# Patient Record
Sex: Female | Born: 1995 | Race: Black or African American | Hispanic: No | Marital: Single | Smoking: Never smoker
Health system: Southern US, Community
[De-identification: ages and names within clinical notes are randomized; demographics above are authoritative.]

## PROBLEM LIST (undated history)

## (undated) DIAGNOSIS — J45909 Unspecified asthma, uncomplicated: Secondary | ICD-10-CM

## (undated) HISTORY — DX: Unspecified asthma, uncomplicated: J45.909

---

## 2015-12-16 ENCOUNTER — Ambulatory Visit: Payer: Self-pay | Admitting: Internal Medicine

## 2015-12-25 ENCOUNTER — Encounter: Payer: Self-pay | Admitting: Internal Medicine

## 2015-12-25 ENCOUNTER — Ambulatory Visit (INDEPENDENT_AMBULATORY_CARE_PROVIDER_SITE_OTHER)
Admission: RE | Admit: 2015-12-25 | Discharge: 2015-12-25 | Disposition: A | Discharge status: Home / Self Care | Payer: BC Managed Care – PPO | Source: Ambulatory Visit | Attending: Internal Medicine | Admitting: Internal Medicine

## 2015-12-25 ENCOUNTER — Ambulatory Visit (INDEPENDENT_AMBULATORY_CARE_PROVIDER_SITE_OTHER): Payer: BC Managed Care – PPO | Admitting: Internal Medicine

## 2015-12-25 VITALS — BP 124/74 | HR 55 | Temp 97.9°F | Ht 69.0 in | Wt 149.0 lb

## 2015-12-25 DIAGNOSIS — R05 Cough: Secondary | ICD-10-CM

## 2015-12-25 DIAGNOSIS — R059 Cough, unspecified: Secondary | ICD-10-CM

## 2015-12-25 MED ORDER — ALBUTEROL SULFATE HFA 108 (90 BASE) MCG/ACT IN AERS: 2.0000 | INHALATION_SPRAY | Freq: Four times a day (QID) | RESPIRATORY_TRACT | Status: DC | PRN

## 2015-12-25 MED ORDER — PREDNISONE 10 MG PO TABS: ORAL_TABLET | ORAL | Status: DC

## 2015-12-25 NOTE — Assessment & Plan Note (Signed)
Etiology unclear, has mild right sided abnormal BS - ? Leading to cough variant asthma; for albut MDI and prednisone trial; consider more long term steroid inhaler if helps, for cxr today, consider pulm consult if not improved

## 2015-12-25 NOTE — Progress Notes (Signed)
   Subjective:    Patient ID: Grace Munoz, female    DOB: 04-25-1996, 20 y.o.   MRN: [id number]  HPI  Here as new pt to establish, has hx of childhood asthma resolved per mother, now with 5 mo persistent nonprod cough; did seem to have a viral illness with mild prod yellowish and fever to start, but the cough just has never then completely resolved.  Course complicated by an episode of reported otitis media last month, but denies overt nasal allergy symptoms or wheezing. Pt denies chest pain, increased sob or doe, wheezing, orthopnea, PND, increased LE swelling, palpitations, dizziness or syncope.  Pt denies new neurological symptoms such as new headache, or facial or extremity weakness or numbness   Pt denies polydipsia, polyuria, No recent travel or unusual pets at home  Denies worsening reflux, abd pain, dysphagia, n/v, bowel change or blood.   Not pregnant/ LMP last wk  Leaving on the train to [place] tonight for back to school. Past Medical History  Diagnosis Date  . Asthma     Childhood   History reviewed. No pertinent past surgical history.  reports that she has never smoked. She does not have any smokeless tobacco history on file. She reports that she does not drink alcohol or use illicit drugs. family history includes Hypertension in her mother. Allergies  Allergen Reactions  . Peanut-Containing Drug Products Anaphylaxis   No current outpatient prescriptions on file prior to visit.   No current facility-administered medications on file prior to visit.   Review of Systems .All otherwise neg per pt    Objective:   Physical Exam BP 124/74 mmHg  Pulse 55  Temp(Src) 97.9 F (36.6 C) (Oral)  Ht 5\' 9"  (1.753 m)  Wt 149 lb (67.586 kg)  BMI 21.99 kg/m2  SpO2 99%  LMP 12/17/2015 VS noted,  Constitutional: Pt appears in no significant distress HENT: Head: NCAT.  Right Ear: External ear normal.  Left Ear: External ear normal.  Eyes: . Pupils are equal, round, and reactive to  light. Conjunctivae and EOM are normal Neck: Normal range of motion. Neck supple.  Cardiovascular: Normal rate and regular rhythm.   Pulmonary/Chest: Effort normal and breath sounds without rales and right lung only rhonchi/wheeze noted.  Abd:  Soft, NT, ND, + BS Neurological: Pt is alert. Not confused , motor grossly intact Skin: Skin is warm. No rash, no LE edema Psychiatric: Pt behavior is normal. No agitation.      Assessment & Plan:

## 2015-12-25 NOTE — Patient Instructions (Signed)
Please take all new medication as prescribed - the inhaler and the prednisone  Please continue all other medications as before, and refills have been done if requested.  Please have the pharmacy call with any other refills you may need.  Please keep your appointments with your specialists as you may have planned  Please go to the XRAY Department in the Basement (go straight as you get off the elevator) for the x-ray testing  You will be contacted by phone if any changes need to be made immediately.  Otherwise, you will receive a letter about your results with an explanation, but please check with MyChart first.  Please remember to sign up for MyChart if you have not done so, as this will be important to you in the future with finding out test results, communicating by private email, and scheduling acute appointments online when needed.

## 2016-06-13 IMAGING — DX DG CHEST 2V
2 series · 2 of 2 positions shown · non-contrast
Comparison: None in PACs

CLINICAL DATA: Cough for the past 5 months; history of childhood
asthma, nonsmoker.

EXAM:
CHEST  2 VIEW

[chest pa]
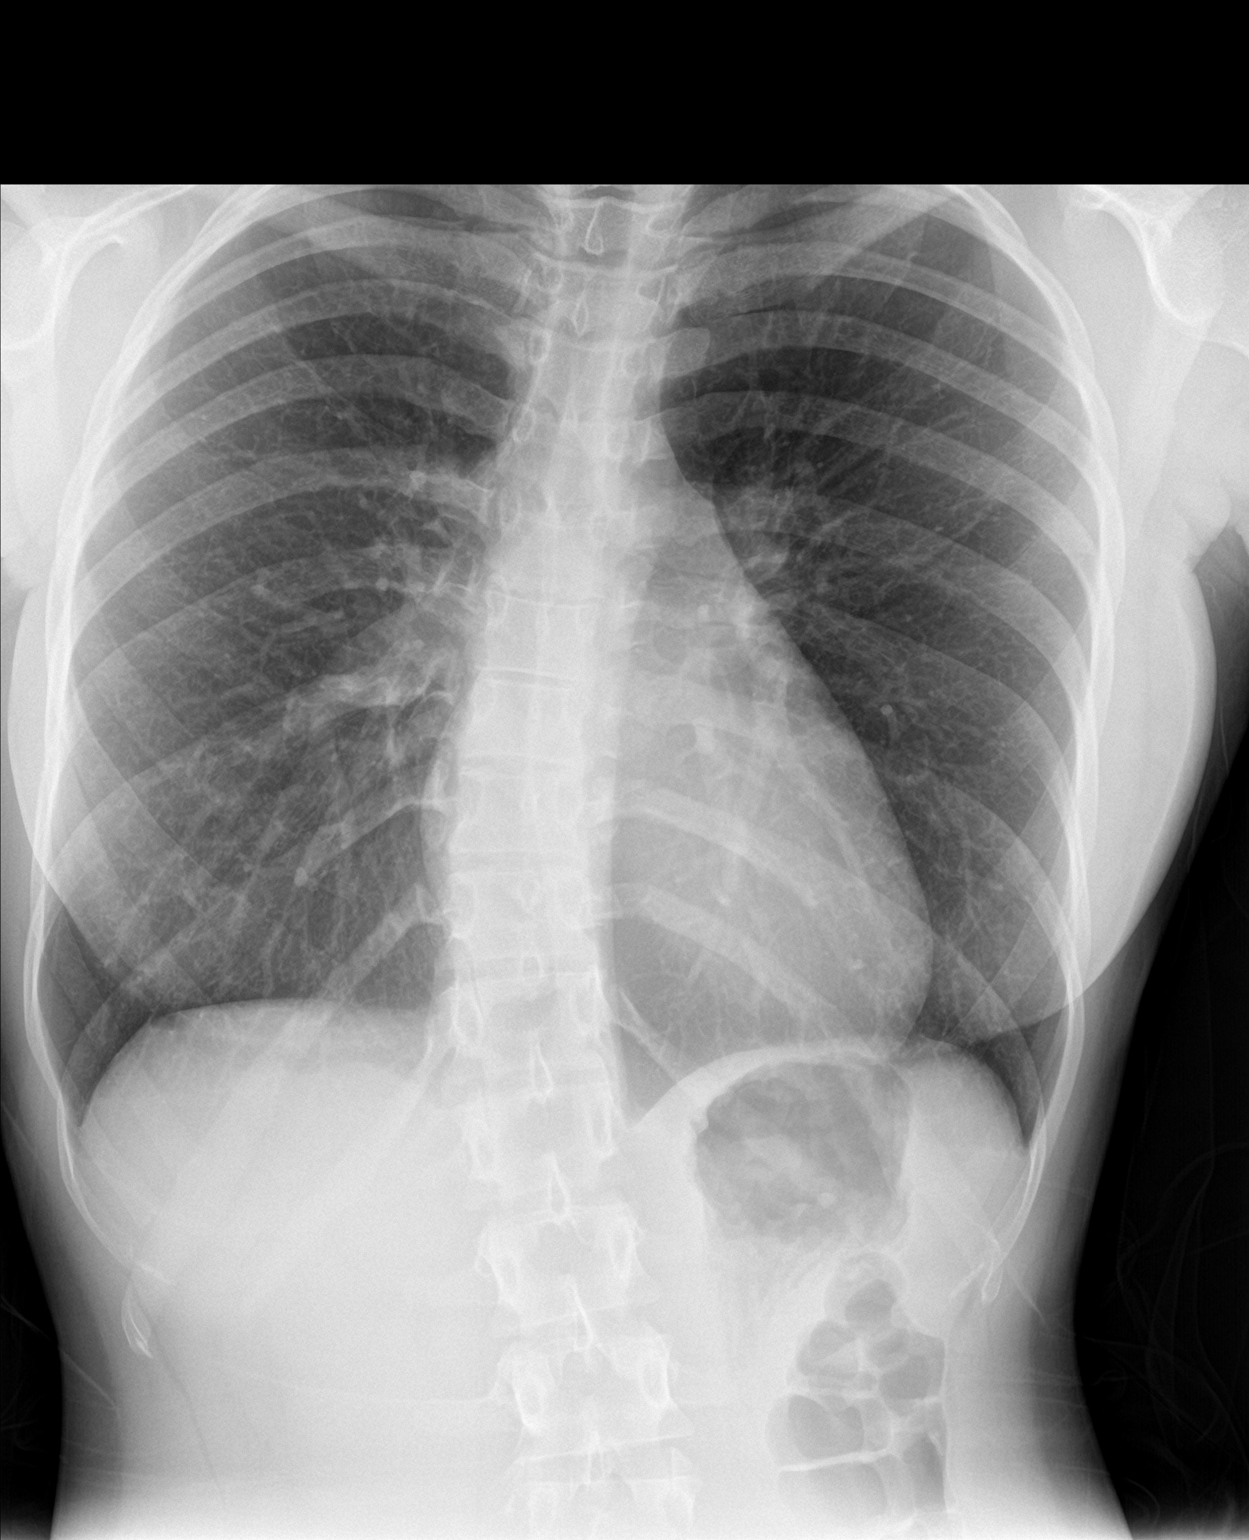

[chest lat]
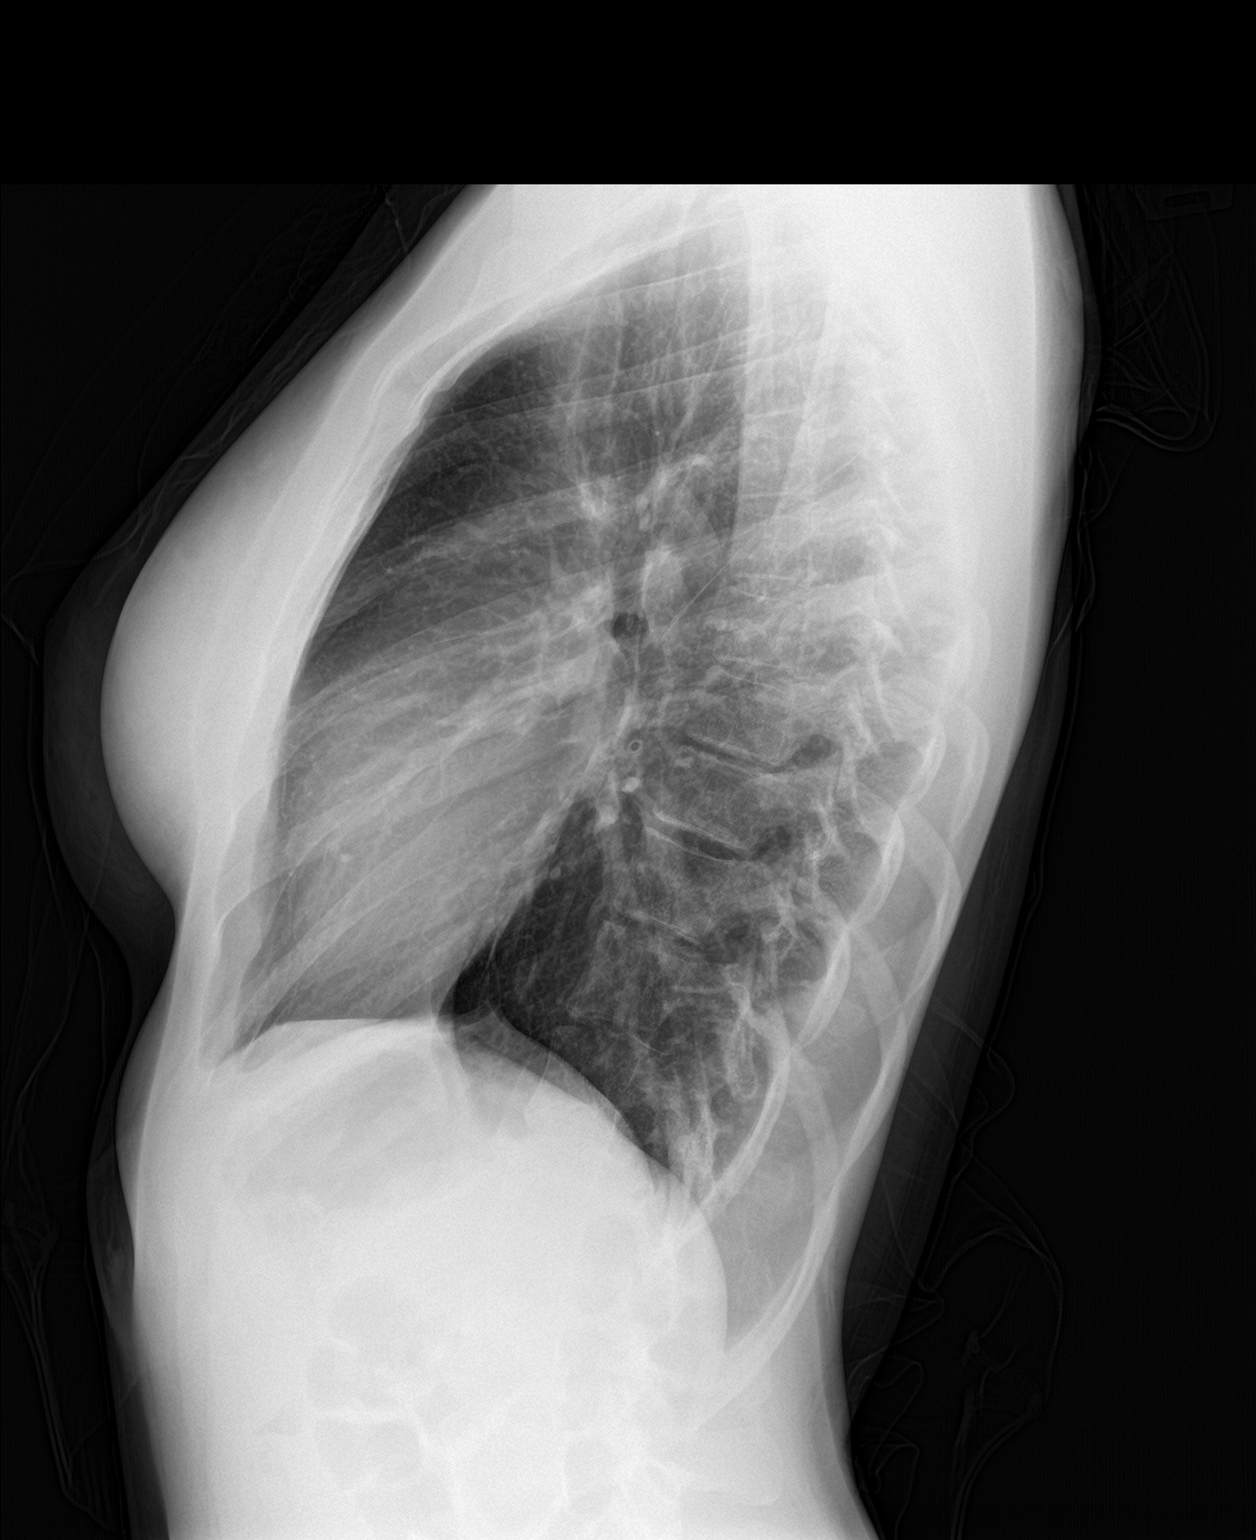

[2 of 2 positions shown; findings below may reference images not displayed]

FINDINGS: The lungs are well-expanded. There is no focal infiltrate. There is
no pleural effusion. The heart and pulmonary vascularity are normal.
The mediastinum is normal in width. There is moderate
dextrocurvature centered at approximately T10.
IMPRESSION: There is no active cardiopulmonary disease.

## 2016-08-04 ENCOUNTER — Ambulatory Visit: Payer: BC Managed Care – PPO | Admitting: Internal Medicine

## 2016-09-26 ENCOUNTER — Ambulatory Visit (INDEPENDENT_AMBULATORY_CARE_PROVIDER_SITE_OTHER): Payer: BC Managed Care – PPO | Admitting: Family Medicine

## 2016-09-26 ENCOUNTER — Ambulatory Visit: Payer: BC Managed Care – PPO | Admitting: Nurse Practitioner

## 2016-09-26 ENCOUNTER — Other Ambulatory Visit: Payer: Self-pay

## 2016-09-26 ENCOUNTER — Encounter: Payer: Self-pay | Admitting: Family Medicine

## 2016-09-26 VITALS — BP 118/70 | HR 60 | Temp 98.6°F | Ht 69.0 in | Wt 146.2 lb

## 2016-09-26 DIAGNOSIS — L74510 Primary focal hyperhidrosis, axilla: Secondary | ICD-10-CM | POA: Diagnosis not present

## 2016-09-26 MED ORDER — ALUMINUM CHLORIDE 20 % EX SOLN: Freq: Every day | CUTANEOUS | 0 refills | Status: DC

## 2016-09-26 NOTE — Progress Notes (Signed)
Subjective:    Patient ID: Grace Munoz, female    DOB: [birth year], 20 y.o.   MRN: [id number]  HPI  Grace Munoz is a 20 year old female who presents today with increased perspiration in the axillae. This has been noted for 2 years however she is concerned as this is resulting in obvious signs when she is wearing her clothing. Perspiration is noted bilaterally and she also notes this mainly in the axillae however palms and soles of feet can sweat at times also. She is seeking care as this is concerning for her when wearing clothing as she noticed sweating on her shirt in the axillae after she attended a social function at school.  Treatment at home includes switching deodorants that provides limited benefit. This works for a period of time however the symptoms of sweating will resume. Symptom improves during sleep. No association with an aggravating factor of stress is present. ROS is negative with the exception of sweating in axillae as noted above.  Review of Systems  Constitutional: Negative for chills, fatigue and fever.  Respiratory: Negative for cough, shortness of breath and wheezing.   Cardiovascular: Negative for chest pain and palpitations.  Gastrointestinal: Negative for abdominal pain, diarrhea, nausea and vomiting.  Endocrine: Negative for cold intolerance and heat intolerance.  Genitourinary: Negative for dysuria, frequency and hematuria.  Musculoskeletal: Negative for arthralgias and myalgias.  Neurological: Negative for dizziness, syncope, weakness, light-headedness, numbness and headaches.  Hematological: Does not bruise/bleed easily.  Psychiatric/Behavioral:       Denies depressed or anxious mood.   Past Medical History:  Diagnosis Date  . Asthma    Childhood     Social History   Social History  . Marital status: Single    Spouse name: N/A  . Number of children: N/A  . Years of education: N/A   Occupational History  . Not on file.   Social History Main Topics  .  Smoking status: Never Smoker  . Smokeless tobacco: Not on file  . Alcohol use No  . Drug use: No  . Sexual activity: Not on file   Other Topics Concern  . Not on file   Social History Narrative  . No narrative on file    No past surgical history on file.  Family History  Problem Relation Age of Onset  . Hypertension Mother     Allergies  Allergen Reactions  . Peanut-Containing Drug Products Anaphylaxis    Current Outpatient Prescriptions on File Prior to Visit  Medication Sig Dispense Refill  . albuterol (PROVENTIL HFA;VENTOLIN HFA) 108 (90 Base) MCG/ACT inhaler Inhale 2 puffs into the lungs every 6 (six) hours as needed for wheezing or shortness of breath. 1 Inhaler 5   No current facility-administered medications on file prior to visit.     BP 118/70 (BP Location: Right Arm, Patient Position: Sitting, Cuff Size: Normal)   Pulse 60   Temp 98.6 F (37 C) (Oral)   Ht 5\' 9"  (1.753 m)   Wt 146 lb 3.2 oz (66.3 kg)   LMP 09/19/2016   BMI 21.59 kg/m       Objective:   Physical Exam  Constitutional: She is oriented to person, place, and time. She appears well-developed and well-nourished.  Eyes: Pupils are equal, round, and reactive to light. No scleral icterus.  Neck: Neck supple.  Cardiovascular: Normal rate and regular rhythm.   Pulmonary/Chest: Effort normal and breath sounds normal. She has no wheezes. She has no rales.  Abdominal: Soft.  Bowel sounds are normal. She exhibits no distension. There is no tenderness. There is no rebound.  Musculoskeletal: She exhibits no edema.  Lymphadenopathy:    She has no cervical adenopathy.    She has no axillary adenopathy.  Neurological: She is alert and oriented to person, place, and time. Coordination normal.  Skin: Skin is warm, dry and intact. No rash noted.  Mild perspiration noted in axillae bilaterally. Minimal perspiration noted on palms bilaterally  Psychiatric: She has a normal mood and affect. Her behavior is  normal. Judgment and thought content normal.        Assessment & Plan:  1. 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 We discussed other options that can be considered after trial of drysol that will require follow up with her PCP and possible dermatology referral. Patient voiced understanding and agreed with plan. - aluminum chloride (DRYSOL) 20 % external solution; Apply topically at bedtime.  Dispense: 35 mL; Refill: 0  Advised patient to follow up with PCP as recommended. She voiced understanding and agreed with plan.  Roddie Mc, FNP-C

## 2016-09-26 NOTE — Progress Notes (Signed)
Pre visit review using our clinic review tool, if applicable. No additional management support is needed unless otherwise documented below in the visit note. 

## 2016-09-26 NOTE — Patient Instructions (Signed)
Please use antiperspirant at night after drying skin thoroughly. If you notice any irritation, you can also use hydrocortisone cream. Follow up with your provider if symptoms do not improve with this treatment and as recommended by him.  Hyperhidrosis It is normal to sweat when you are hot, being physically active, or feeling anxious. Sweating is a necessary function for your body. However, hyperhidrosis is when you sweat too much (excessively). Although hyperhidrosis is not dangerous, it can make you feel embarrassed.  There are two kinds of hyperhidrosis:  Primary hyperhidrosis. The sweating usually localizes in one part of your body, such as your underarms, or in a few areas, such as your feet, face, armpits, and hands. This is the more common kind of hyperhidrosis.  Secondary hyperhidrosis. This type more likely affects your entire body. CAUSES The cause of your hyperhidrosis depends on the kind you have.  Primary hyperhidrosis may be caused by having sweat glands that are more active than normal.  Secondary hyperhidrosis is caused by an underlying condition. Possible conditions include:  Diabetes.  Gout.  Certain medicines.  Anxiety.  Stroke.  Obesity.  Menopause.  Overactive thyroid (hyperthyroidism).  Tumors.  Frostbite.  Certain types of cancers.  Alcoholism.  Injury to your nervous system.  Stroke.  Parkinson disease. RISK FACTORS You may be at an increased risk for primary hyperhidrosis if you have a family history of it. SIGNS AND SYMPTOMS General symptoms of hyperhidrosis may include:  Feeling like you are sweating constantly, even while you are resting.  Having skin that peels or gets paler or softer in the areas where you sweat the most.  Being able to see sweat on your skin. Symptoms of primary hyperhidrosis may include:  Sweating in specific areas, such as your armpits, palms, feet, and face.  Sweating in the same location on both sides of  your body.  Sweating only during the day. Symptoms of secondary hyperhidrosis may include:  Sweating all over your body.  Sweating even while you sleep. DIAGNOSIS  Hyperhidrosis may be diagnosed by:  Medical history and physical exam.  Testing, such as:  Sweat test.  Paper test. TREATMENT Your treatment will depend on the kind of hyperhidrosis you have and the parts of your body that are affected. If your hyperhidrosis is caused by an underlying condition, your treatment will address the cause. Treatment may include:  Strong antiperspirants. Your health care provider may give you a prescription.  Medicines taken by mouth.  Medicines injected by your health care provider. These may include small amounts of botulinum toxin.  Iontophoresis. This is a procedure that temporarily turns off the sweat glands in your hands and feet.  Surgery to remove your sweat glands.  Sympathectomy. This is a procedure that cuts or destroys your nerves so that they do not send a signal to sweat. HOME CARE INSTRUCTIONS  Take medicines only as directed by your health care provider.  Use antiperspirants as directed by your health care provider.  Limit or avoid foods or beverages that seem to increase your chances of sweating, such as:  Spicy food.  Caffeine.  Alcohol.  Foods that contain MSG.  If your feet sweat:  Wear sandals, when possible.  Do not wear cotton socks. Wear socks that remove or wick moisture from your feet.  Wear leather shoes.  Avoid wearing the same pair of shoes two days in a row.  Consider joining a hyperhidrosis support group. SEEK MEDICAL CARE IF:   You have new symptoms.  Your symptoms get worse.   This information is not intended to replace advice given to you by your health care provider. Make sure you discuss any questions you have with your health care provider.   Document Released: 02/03/2006 Document Revised: 12/26/2014 Document Reviewed:  07/15/2014 Elsevier Interactive Patient Education [employer]2016 [employer]vier Inc.

## 2017-01-13 ENCOUNTER — Encounter: Payer: Self-pay | Admitting: Internal Medicine

## 2017-01-13 ENCOUNTER — Other Ambulatory Visit (INDEPENDENT_AMBULATORY_CARE_PROVIDER_SITE_OTHER): Payer: BC Managed Care – PPO

## 2017-01-13 ENCOUNTER — Other Ambulatory Visit: Payer: Self-pay | Admitting: Internal Medicine

## 2017-01-13 ENCOUNTER — Ambulatory Visit (INDEPENDENT_AMBULATORY_CARE_PROVIDER_SITE_OTHER): Payer: BC Managed Care – PPO | Admitting: Internal Medicine

## 2017-01-13 VITALS — BP 118/76 | HR 84 | Temp 98.2°F | Resp 20 | Wt 140.0 lb

## 2017-01-13 DIAGNOSIS — Z0001 Encounter for general adult medical examination with abnormal findings: Secondary | ICD-10-CM

## 2017-01-13 DIAGNOSIS — Z0000 Encounter for general adult medical examination without abnormal findings: Secondary | ICD-10-CM | POA: Insufficient documentation

## 2017-01-13 DIAGNOSIS — D649 Anemia, unspecified: Secondary | ICD-10-CM

## 2017-01-13 DIAGNOSIS — F411 Generalized anxiety disorder: Secondary | ICD-10-CM | POA: Insufficient documentation

## 2017-01-13 DIAGNOSIS — Z91010 Allergy to peanuts: Secondary | ICD-10-CM

## 2017-01-13 LAB — CBC WITH DIFFERENTIAL/PLATELET
Basophils Absolute: 0 10*3/uL (ref 0.0–0.1)
Basophils Relative: 0.3 % (ref 0.0–3.0)
EOS PCT: 5.6 % — AB (ref 0.0–5.0)
Eosinophils Absolute: 0.3 10*3/uL (ref 0.0–0.7)
HCT: 36.7 % (ref 36.0–46.0)
HEMOGLOBIN: 12 g/dL (ref 12.0–15.0)
Lymphocytes Relative: 36.9 % (ref 12.0–46.0)
Lymphs Abs: 1.8 10*3/uL (ref 0.7–4.0)
MCHC: 32.6 g/dL (ref 30.0–36.0)
MCV: 80.8 fl (ref 78.0–100.0)
MONOS PCT: 5.8 % (ref 3.0–12.0)
Monocytes Absolute: 0.3 10*3/uL (ref 0.1–1.0)
Neutro Abs: 2.5 10*3/uL (ref 1.4–7.7)
Neutrophils Relative %: 51.4 % (ref 43.0–77.0)
Platelets: 219 10*3/uL (ref 150.0–400.0)
RBC: 4.54 Mil/uL (ref 3.87–5.11)
RDW: 16.2 % — ABNORMAL HIGH (ref 11.5–14.6)
WBC: 4.9 10*3/uL (ref 4.5–10.5)

## 2017-01-13 LAB — HEPATIC FUNCTION PANEL
ALBUMIN: 4.3 g/dL (ref 3.5–5.2)
ALT: 9 U/L (ref 0–35)
AST: 14 U/L (ref 0–37)
Alkaline Phosphatase: 50 U/L (ref 39–117)
Bilirubin, Direct: 0.1 mg/dL (ref 0.0–0.3)
Total Bilirubin: 0.3 mg/dL (ref 0.2–1.2)
Total Protein: 7.6 g/dL (ref 6.0–8.3)

## 2017-01-13 LAB — IBC PANEL
Iron: 70 ug/dL (ref 42–145)
Saturation Ratios: 14.7 % — ABNORMAL LOW (ref 20.0–50.0)
TRANSFERRIN: 340 mg/dL (ref 212.0–360.0)

## 2017-01-13 LAB — URINALYSIS, ROUTINE W REFLEX MICROSCOPIC
Bilirubin Urine: NEGATIVE
KETONES UR: NEGATIVE
NITRITE: NEGATIVE
PH: 6 (ref 5.0–8.0)
Specific Gravity, Urine: >=1.03 — AB (ref 1.000–1.030)
URINE GLUCOSE: NEGATIVE
Urobilinogen, UA: 0.2 (ref 0.0–1.0)

## 2017-01-13 LAB — BASIC METABOLIC PANEL
BUN: 8 mg/dL (ref 6–23)
CALCIUM: 9.5 mg/dL (ref 8.4–10.5)
CO2: 30 mEq/L (ref 19–32)
CREATININE: 0.93 mg/dL (ref 0.40–1.20)
Chloride: 104 mEq/L (ref 96–112)
GFR: 98.02 mL/min (ref >60.00)
GLUCOSE: 85 mg/dL (ref 70–99)
Potassium: 4 mEq/L (ref 3.5–5.1)
Sodium: 140 mEq/L (ref 135–145)

## 2017-01-13 LAB — LIPID PANEL
CHOLESTEROL: 134 mg/dL (ref 0–200)
HDL: 67.8 mg/dL (ref >39.00)
LDL CALC: 58 mg/dL (ref 0–99)
NonHDL: 66.22
TRIGLYCERIDES: 42 mg/dL (ref 0.0–149.0)
Total CHOL/HDL Ratio: 2
VLDL: 8.4 mg/dL (ref 0.0–40.0)

## 2017-01-13 LAB — TSH: TSH: 1.78 u[IU]/mL (ref 0.35–5.50)

## 2017-01-13 MED ORDER — CEPHALEXIN 500 MG PO CAPS: 500.0000 mg | ORAL_CAPSULE | Freq: Three times a day (TID) | ORAL | 0 refills | Status: AC

## 2017-01-13 MED ORDER — EPINEPHRINE 0.3 MG/0.3ML IJ SOAJ: 0.3000 mg | Freq: Once | INTRAMUSCULAR | 2 refills | Status: AC

## 2017-01-13 NOTE — Patient Instructions (Addendum)
Please take all new medication as prescribed  - the epipen for severe reactions  Please also have some OTC Bendaryl 50 mg available to use for lesser reactions  Please continue all other medications as before, and refills have been done if requested.  Please have the pharmacy call with any other refills you may need.  Please continue your efforts at being more active, low cholesterol diet, and weight control.  You are otherwise up to date with prevention measures today.  Please keep your appointments with your specialists as you may have planned  Please go to the LAB in the Basement (turn left off the elevator) for the tests to be done today  You will be contacted by phone if any changes need to be made immediately.  Otherwise, you will receive a letter about your results with an explanation, but please check with MyChart first.  Please remember to sign up for MyChart if you have not done so, as this will be important to you in the future with finding out test results, communicating by private email, and scheduling acute appointments online when needed.

## 2017-01-13 NOTE — Progress Notes (Signed)
Pre visit review using our clinic review tool, if applicable. No additional management support is needed unless otherwise documented below in the visit note. 

## 2017-01-13 NOTE — Telephone Encounter (Signed)
Done hardcopy to Corinne as there is no local pharmacy of record

## 2017-01-13 NOTE — Telephone Encounter (Signed)
Called patient waiting on response

## 2017-01-13 NOTE — Progress Notes (Signed)
Subjective:    Patient ID: Grace Munoz, female    DOB: 1996/02/29, 21 y.o.   MRN: [id number]  HPI  Here for wellness and f/u;  Overall doing ok;  Pt denies Chest pain, worsening SOB, DOE, wheezing, orthopnea, PND, worsening LE edema, palpitations, dizziness or syncope.  Pt denies neurological change such as new headache, facial or extremity weakness.  Pt denies polydipsia, polyuria, or low sugar symptoms. Pt states overall good compliance with treatment and medications, good tolerability, and has been trying to follow appropriate diet.  Pt denies worsening depressive symptoms, suicidal ideation or panic. No fever, night sweats, wt loss, loss of appetite, or other constitutional symptoms.  Pt states good ability with ADL's, has low fall risk, home safety reviewed and adequate, no other significant changes in hearing or vision, and usually active with exercise.  Declines immunizations  Did have episode recenlty with tongue swelling after eating peanut. Past Medical History:  Diagnosis Date  . Asthma    Childhood   No past surgical history on file.  reports that she has never smoked. She does not have any smokeless tobacco history on file. She reports that she does not drink alcohol or use drugs. family history includes Hypertension in her mother. Allergies  Allergen Reactions  . Peanut-Containing Drug Products Anaphylaxis   Current Outpatient Prescriptions on File Prior to Visit  Medication Sig Dispense Refill  . albuterol (PROVENTIL HFA;VENTOLIN HFA) 108 (90 Base) MCG/ACT inhaler Inhale 2 puffs into the lungs every 6 (six) hours as needed for wheezing or shortness of breath. (Patient not taking: Reported on 01/13/2017) 1 Inhaler 5  . aluminum chloride (DRYSOL) 20 % external solution Apply topically at bedtime. (Patient not taking: Reported on 01/13/2017) 35 mL 0   No current facility-administered medications on file prior to visit.     Review of Systems Constitutional: Negative for increased  diaphoresis, or other activity, appetite or siginficant weight change other than noted HENT: Negative for worsening hearing loss, ear pain, facial swelling, mouth sores and neck stiffness.   Eyes: Negative for other worsening pain, redness or visual disturbance.  Respiratory: Negative for choking or stridor Cardiovascular: Negative for other chest pain and palpitations.  Gastrointestinal: Negative for worsening diarrhea, blood in stool, or abdominal distention Genitourinary: Negative for hematuria, flank pain or change in urine volume.  Musculoskeletal: Negative for myalgias or other joint complaints.  Skin: Negative for other color change and wound or drainage.  Neurological: Negative for syncope and numbness. other than noted Hematological: Negative for adenopathy. or other swelling Psychiatric/Behavioral: Negative for hallucinations, SI, self-injury, decreased concentration or other worsening agitation.  All other system neg per pt    Objective:   Physical Exam BP 118/76   Pulse 84   Temp 98.2 F (36.8 C) (Oral)   Resp 20   Wt 140 lb (63.5 kg)   SpO2 97%   BMI 20.67 kg/m  VS noted,  Constitutional: Pt is oriented to person, place, and time. Appears well-developed and well-nourished, in no significant distress Head: Normocephalic and atraumatic  Eyes: Conjunctivae and EOM are normal. Pupils are equal, round, and reactive to light Right Ear: External ear normal.  Left Ear: External ear normal Nose: Nose normal.  Mouth/Throat: Oropharynx is clear and moist  Neck: Normal range of motion. Neck supple. No JVD present. No tracheal deviation present or significant neck LA or mass Cardiovascular: Normal rate, regular rhythm, normal heart sounds and intact distal pulses.   Pulmonary/Chest: Effort normal and breath sounds without  rales or wheezing  Abdominal: Soft. Bowel sounds are normal. NT. No HSM  Musculoskeletal: Normal range of motion. Exhibits no edema Lymphadenopathy: Has no  cervical adenopathy.  Neurological: Pt is alert and oriented to person, place, and time. Pt has normal reflexes. No cranial nerve deficit. Motor grossly intact Skin: Skin is warm and dry. No rash noted or new ulcers Psychiatric:  Has normal mood and affect. Behavior is normal.  No other new exam findings       Assessment & Plan:

## 2017-01-14 NOTE — Assessment & Plan Note (Signed)
For epipen, and benadryl 50 q 6 prn, avoid peanut, declines allergy referral

## 2017-01-14 NOTE — Assessment & Plan Note (Signed)

## 2017-01-14 NOTE — Assessment & Plan Note (Signed)
Also for iron panel with labs,  to f/u any worsening symptoms or concerns  

## 2017-01-23 ENCOUNTER — Telehealth: Payer: Self-pay | Admitting: Internal Medicine

## 2017-01-23 NOTE — Telephone Encounter (Signed)
Please return phone call ° °

## 2017-01-23 NOTE — Telephone Encounter (Signed)
States patient never got keflex.  Is requesting to get in touch with [employer].  Please follow up in regard.

## 2017-01-23 NOTE — Telephone Encounter (Signed)
Patients mother called back. The pharmacy [facility] [place]. Phone number is [phone]. Thanks.

## 2017-01-23 NOTE — Telephone Encounter (Signed)
Called patients mother, waiting for a return phone call back.

## 2017-01-23 NOTE — Telephone Encounter (Signed)
Spoke to patient mom they are getting the information for the pharmacy at school so we can send the prescription there

## 2017-01-24 ENCOUNTER — Telehealth: Payer: Self-pay

## 2017-01-24 MED ORDER — CEPHALEXIN 500 MG PO CAPS: 500.0000 mg | ORAL_CAPSULE | Freq: Three times a day (TID) | ORAL | 0 refills | Status: DC

## 2017-01-24 NOTE — Telephone Encounter (Signed)
Medication was sent to pharmacy.

## 2017-01-24 NOTE — Telephone Encounter (Signed)
Medication sent to pharmacy  

## 2017-02-20 NOTE — Progress Notes (Signed)
[name] [address] [place], [place] [address] Phone: [phone] Subjective:    I'm seeing this patient by the request  of:  Oliver Barre, MD   CC: Right swollen ankle  [id number]  Grace Munoz is a 21 y.o. female coming in with complaint of right ankle swelling, after running 5k a month ago.  Still was able to workout. Patient states unfortunate she has noticed that it seems to be larger on the outside of her ankle. Patient states that it is not extremely painful. Patient states though that that is always there. Patient did play volleyball highly competitively for quite some time and did have multiple ankle injuries. States that this wound felt a little different. Patient states that she is almost pain free at this point but is concerned about increasing activity. No numbness, no weakness.      Past Medical History:  Diagnosis Date  . Asthma    Childhood   No past surgical history on file. Social History   Social History  . Marital status: Single    Spouse name: N/A  . Number of children: N/A  . Years of education: N/A   Social History Main Topics  . Smoking status: Never Smoker  . Smokeless tobacco: Not on file  . Alcohol use No  . Drug use: No  . Sexual activity: Not on file   Other Topics Concern  . Not on file   Social History Narrative  . No narrative on file   Allergies  Allergen Reactions  . Peanut-Containing Drug Products Anaphylaxis   Family History  Problem Relation Age of Onset  . Hypertension Mother     Past medical history, social, surgical and family history all reviewed in electronic medical record.  No pertanent information unless stated regarding to the chief complaint.   Review of Systems:Review of systems updated and as accurate as of 02/21/17  No headache, visual changes, nausea, vomiting, diarrhea, constipation, dizziness, abdominal pain, skin rash, fevers, chills, night sweats, weight loss, swollen  lymph nodes, body aches, joint swelling, muscle aches, chest pain, shortness of breath, mood changes.   Objective  Blood pressure 112/82, pulse 62, height 5\' 9"  (1.753 m), weight 143 lb (64.9 kg). Systems examined below as of 02/21/17   General: No apparent distress alert and oriented x3 mood and affect normal, dressed appropriately.  HEENT: Pupils equal, extraocular movements intact  Respiratory: Patient's speak in full sentences and does not appear short of breath  Cardiovascular: No lower extremity edema, non tender, no erythema  Skin: Warm dry intact with no signs of infection or rash on extremities or on axial skeleton.  Abdomen: Soft nontender  Neuro: Cranial nerves II through XII are intact, neurovascularly intact in all extremities with 2+ DTRs and 2+ pulses.  Lymph: No lymphadenopathy of posterior or anterior cervical chain or axillae bilaterally.  Gait normal with good balance and coordination.  MSK:  Non tender with full range of motion and good stability and symmetric strength and tone of shoulders, elbows, wrist, hip, knees bilaterally.  Foot exam shows pes planus bilaterally. There'll foot Ankle: Right Mild soft tissue swelling over the lateral malleolus area. Range of motion is full in all directions. Strength is 5/5 in all directions. Stable lateral and medial ligaments; squeeze test and kleiger test unremarkable; Talar dome nontender; No pain at base of 5th MT; No tenderness over cuboid; No tenderness over N spot or navicular prominence No tenderness on posterior aspects of lateral  and medial malleolus No sign of peroneal tendon subluxations or tenderness to palpation Negative tarsal tunnel tinel's Able to walk 4 steps.  MSK US performed of: Right ankle This study was ordered, performed, and interpreted by Terrilee FilesZach Smith D.O.  Foot/Ankle:   All structures visualized.   Talar dome unremarkable  Ankle mortise without effusion. Patient does have some hypertrophy of the  peroneus longus tendon as well as what seems to be an overlying lipoma. No increasing Doppler flow. Underlying lateral malleolus does having increasing Doppler flow. Mild hypoechoic over the bone itself.  IMPRESSION: Lateral malleolus stress fracture with overlying lipoma.  Procedure note 97110; 15 minutes spent for Therapeutic exercises as stated in above notes.  This included exercises focusing on stretching, strengthening, with significant focus on eccentric aspects.  Ankle strengthening that included:  Basic range of motion exercises to allow proper full motion at ankle Stretching of the lower leg and hamstrings  Theraband exercises for the lower leg - inversion, eversion, dorsiflexion and plantarflexion each to be completed with a theraband Balance exercises to increase proprioception Weight bearing exercises to increase strength and balance  Proper technique shown and discussed handout in great detail with ATC.  All questions were discussed and answered.     Impression and Recommendations:     This case required medical decision making of moderate complexity.      Note: This dictation was prepared with Dragon dictation along with smaller phrase technology. Any transcriptional errors that result from this process are unintentional.

## 2017-02-21 ENCOUNTER — Ambulatory Visit: Payer: Self-pay

## 2017-02-21 ENCOUNTER — Ambulatory Visit (INDEPENDENT_AMBULATORY_CARE_PROVIDER_SITE_OTHER): Payer: BC Managed Care – PPO | Admitting: Family Medicine

## 2017-02-21 VITALS — BP 112/82 | HR 62 | Ht 69.0 in | Wt 143.0 lb

## 2017-02-21 DIAGNOSIS — M84369A Stress fracture, unspecified tibia and fibula, initial encounter for fracture: Secondary | ICD-10-CM | POA: Insufficient documentation

## 2017-02-21 DIAGNOSIS — M25571 Pain in right ankle and joints of right foot: Secondary | ICD-10-CM | POA: Diagnosis not present

## 2017-02-21 MED ORDER — VITAMIN D (ERGOCALCIFEROL) 1.25 MG (50000 UNIT) PO CAPS: 50000.0000 [IU] | ORAL_CAPSULE | ORAL | 0 refills | Status: DC

## 2017-02-21 NOTE — Assessment & Plan Note (Signed)
I believe the patient's pain was likely secondary to a stress reaction. Put on once weekly vitamin D that it think will be beneficial. We discussed with patient that icing regimen. Home exercises given today. We discussed objective is to do a which ones to potentially avoid. I do not feel that further workup is necessary at this time but if and there is any increasing in enlargement we will get x-rays. Likelihood of any type of bony abnormality other than a stress reaction is very rare at this time. Patient try these changes and come back and see me again in 4 weeks.

## 2017-02-21 NOTE — Patient Instructions (Addendum)
Good to see you.  Ice 20 minutes 2 times daily. Usually after activity and before bed. Exercises 3 times a week.  pennsaid pinkie amount topically 2 times daily as needed.  Once weekly vitamin D for 12 weeks to help healing.  Good shoes with rigid bottom.  Dierdre HarnessKeen, Dansko, Merrell or New balance greater then 700, I know you are not going to do this.  I want you to see me again in 6 weeks to make sure you are doing well Have a fun and safe spring break!

## 2017-03-31 ENCOUNTER — Ambulatory Visit (INDEPENDENT_AMBULATORY_CARE_PROVIDER_SITE_OTHER): Payer: BC Managed Care – PPO | Admitting: Family Medicine

## 2017-03-31 ENCOUNTER — Ambulatory Visit: Payer: Self-pay

## 2017-03-31 ENCOUNTER — Encounter: Payer: Self-pay | Admitting: Family Medicine

## 2017-03-31 VITALS — BP 116/80 | HR 53 | Resp 16 | Wt 140.5 lb

## 2017-03-31 DIAGNOSIS — M25571 Pain in right ankle and joints of right foot: Secondary | ICD-10-CM

## 2017-03-31 DIAGNOSIS — M84369D Stress fracture, unspecified tibia and fibula, subsequent encounter for fracture with routine healing: Secondary | ICD-10-CM | POA: Diagnosis not present

## 2017-03-31 NOTE — Assessment & Plan Note (Signed)
No longer having any pain at this time. Discussed with patient at great length. Patient will increase activity as tolerated. Worsening pain come back for further evaluation otherwise follow-up as needed.

## 2017-03-31 NOTE — Progress Notes (Signed)
  [name] [address] [place], [place] [address] Phone: [phone] Subjective:    I'm seeing this patient by the request  of:  Oliver Barre, MD   CC: Right swollen ankle f/u   [id number]  Grace Munoz is a 21 y.o. female coming in with complaint of right ankle swelling, Patient was found to have what appeared to be a stress reaction of the ankle. Patient has been doing conservative therapy as well as vitamin D. Patient has done significantly improved at this time. Patient states that she is not having any pain at this time.      Past Medical History:  Diagnosis Date  . Asthma    Childhood   History reviewed. No pertinent surgical history. Social History   Social History  . Marital status: Single    Spouse name: N/A  . Number of children: N/A  . Years of education: N/A   Social History Main Topics  . Smoking status: Never Smoker  . Smokeless tobacco: Never Used  . Alcohol use No  . Drug use: No  . Sexual activity: Not Asked   Other Topics Concern  . None   Social History Narrative  . None   Allergies  Allergen Reactions  . Peanut-Containing Drug Products Anaphylaxis   Family History  Problem Relation Age of Onset  . Hypertension Mother     Past medical history, social, surgical and family history all reviewed in electronic medical record.  No pertanent information unless stated regarding to the chief complaint.   Review of Systems: No headache, visual changes, nausea, vomiting, diarrhea, constipation, dizziness, abdominal pain, skin rash, fevers, chills, night sweats, weight loss, swollen lymph nodes, body aches, joint swelling, muscle aches, chest pain, shortness of breath, mood changes.    Objective  Blood pressure 116/80, pulse (!) 53, resp. rate 16, weight 140 lb 8 oz (63.7 kg), SpO2 93 %.  Systems examined below as of 03/31/17 General: NAD A&O x3 mood, affect normal  HEENT: Pupils equal, extraocular movements intact  no nystagmus Respiratory: not short of breath at rest or with speaking Cardiovascular: No lower extremity edema, non tender Skin: Warm dry intact with no signs of infection or rash on extremities or on axial skeleton. Abdomen: Soft nontender, no masses Neuro: Cranial nerves  intact, neurovascularly intact in all extremities with 2+ DTRs and 2+ pulses. Lymph: No lymphadenopathy appreciated today  Gait normal with good balance and coordination.  MSK: Non tender with full range of motion and good stability and symmetric strength and tone of shoulders, elbows, wrist,  knee hips and ankles bilaterally.   Foot exam shows pes planus bilaterally.  Ankle: Right No visible erythema or swelling. Range of motion is full in all directions. Strength is 5/5 in all directions. Stable lateral and medial ligaments; squeeze test and kleiger test unremarkable; Talar dome nontender; No pain at base of 5th MT; No tenderness over cuboid; No tenderness over N spot or navicular prominence No tenderness on posterior aspects of lateral and medial malleolus No sign of peroneal tendon subluxations or tenderness to palpation Negative tarsal tunnel tinel's Able to walk 4 steps. Contralateral ankle unremarkable     Impression and Recommendations:     This case required medical decision making of moderate complexity.      Note: This dictation was prepared with Dragon dictation along with smaller phrase technology. Any transcriptional errors that result from this process are unintentional.

## 2017-03-31 NOTE — Progress Notes (Signed)
Pre-visit discussion using our clinic review tool. No additional management support is needed unless otherwise documented below in the visit note.  

## 2018-05-02 ENCOUNTER — Ambulatory Visit: Payer: BC Managed Care – PPO | Admitting: Internal Medicine

## 2018-05-03 ENCOUNTER — Ambulatory Visit (INDEPENDENT_AMBULATORY_CARE_PROVIDER_SITE_OTHER): Payer: BC Managed Care – PPO | Admitting: Internal Medicine

## 2018-05-03 ENCOUNTER — Other Ambulatory Visit (INDEPENDENT_AMBULATORY_CARE_PROVIDER_SITE_OTHER): Payer: BC Managed Care – PPO

## 2018-05-03 ENCOUNTER — Encounter: Payer: Self-pay | Admitting: Internal Medicine

## 2018-05-03 VITALS — BP 114/76 | HR 68 | Temp 98.6°F | Ht 69.0 in | Wt 142.0 lb

## 2018-05-03 DIAGNOSIS — Z114 Encounter for screening for human immunodeficiency virus [HIV]: Secondary | ICD-10-CM | POA: Diagnosis not present

## 2018-05-03 DIAGNOSIS — F411 Generalized anxiety disorder: Secondary | ICD-10-CM | POA: Diagnosis not present

## 2018-05-03 DIAGNOSIS — Z0000 Encounter for general adult medical examination without abnormal findings: Secondary | ICD-10-CM

## 2018-05-03 DIAGNOSIS — F419 Anxiety disorder, unspecified: Secondary | ICD-10-CM

## 2018-05-03 LAB — BASIC METABOLIC PANEL
BUN: 12 mg/dL (ref 6–23)
CALCIUM: 9.6 mg/dL (ref 8.4–10.5)
CO2: 29 mEq/L (ref 19–32)
CREATININE: 0.87 mg/dL (ref 0.40–1.20)
Chloride: 102 mEq/L (ref 96–112)
GFR: 104.57 mL/min (ref >60.00)
Glucose, Bld: 72 mg/dL (ref 70–99)
Potassium: 4.1 mEq/L (ref 3.5–5.1)
SODIUM: 137 meq/L (ref 135–145)

## 2018-05-03 LAB — HEPATIC FUNCTION PANEL
ALBUMIN: 4.5 g/dL (ref 3.5–5.2)
ALT: 12 U/L (ref 0–35)
AST: 20 U/L (ref 0–37)
Alkaline Phosphatase: 57 U/L (ref 39–117)
Bilirubin, Direct: 0.1 mg/dL (ref 0.0–0.3)
TOTAL PROTEIN: 8 g/dL (ref 6.0–8.3)
Total Bilirubin: 0.3 mg/dL (ref 0.2–1.2)

## 2018-05-03 LAB — CBC WITH DIFFERENTIAL/PLATELET
BASOS ABS: 0 10*3/uL (ref 0.0–0.1)
BASOS PCT: 0.8 % (ref 0.0–3.0)
EOS ABS: 0.2 10*3/uL (ref 0.0–0.7)
Eosinophils Relative: 4.1 % (ref 0.0–5.0)
HCT: 38.7 % (ref 36.0–46.0)
Hemoglobin: 12.7 g/dL (ref 12.0–15.0)
Lymphocytes Relative: 53 % — ABNORMAL HIGH (ref 12.0–46.0)
Lymphs Abs: 2.2 10*3/uL (ref 0.7–4.0)
MCHC: 32.7 g/dL (ref 30.0–36.0)
MCV: 80.8 fl (ref 78.0–100.0)
Monocytes Absolute: 0.3 10*3/uL (ref 0.1–1.0)
Monocytes Relative: 8.1 % (ref 3.0–12.0)
NEUTROS ABS: 1.4 10*3/uL (ref 1.4–7.7)
Neutrophils Relative %: 34 % — ABNORMAL LOW (ref 43.0–77.0)
PLATELETS: 305 10*3/uL (ref 150.0–400.0)
RBC: 4.8 Mil/uL (ref 3.87–5.11)
RDW: 18.8 % — AB (ref 11.5–15.5)
WBC: 4.1 10*3/uL (ref 4.0–10.5)

## 2018-05-03 LAB — LIPID PANEL
Cholesterol: 149 mg/dL (ref 0–200)
HDL: 68.6 mg/dL (ref >39.00)
LDL Cholesterol: 70 mg/dL (ref 0–99)
NonHDL: 80.48
TRIGLYCERIDES: 50 mg/dL (ref 0.0–149.0)
Total CHOL/HDL Ratio: 2
VLDL: 10 mg/dL (ref 0.0–40.0)

## 2018-05-03 LAB — URINALYSIS, ROUTINE W REFLEX MICROSCOPIC
Bilirubin Urine: NEGATIVE
HGB URINE DIPSTICK: NEGATIVE
Ketones, ur: NEGATIVE
Leukocytes, UA: NEGATIVE
NITRITE: NEGATIVE
Urine Glucose: NEGATIVE
Urobilinogen, UA: 0.2 (ref 0.0–1.0)
pH: 5.5 (ref 5.0–8.0)

## 2018-05-03 LAB — TSH: TSH: 1.27 u[IU]/mL (ref 0.35–4.50)

## 2018-05-03 NOTE — Patient Instructions (Addendum)
Please continue all other medications as before, and refills have been done if requested.  Please have the pharmacy call with any other refills you may need.  Please continue your efforts at being more active, low cholesterol diet, and weight control.  You are otherwise up to date with prevention measures today.  Please keep your appointments with your specialists as you may have planned  You will be contacted regarding the referral for: psychology  Please go to the LAB in the Basement (turn left off the elevator) for the tests to be done today  You will be contacted by phone if any changes need to be made immediately.  Otherwise, you will receive a letter about your results with an explanation, but please check with MyChart first.  Please remember to sign up for MyChart if you have not done so, as this will be important to you in the future with finding out test results, communicating by private email, and scheduling acute appointments online when needed.  Please return in 1 year for your yearly visit, or sooner if needed

## 2018-05-03 NOTE — Progress Notes (Signed)
Subjective:    Patient ID: Grace Munoz, female    DOB: Jan 31, 1996, 22 y.o.   MRN: [id number]  HPI  Here for wellness and f/u;  Overall doing ok;  Pt denies Chest pain, worsening SOB, DOE, wheezing, orthopnea, PND, worsening LE edema, palpitations, dizziness or syncope.  Pt denies neurological change such as new headache, facial or extremity weakness.  Pt denies polydipsia, polyuria, or low sugar symptoms. Pt states overall good compliance with treatment and medications, good tolerability, and has been trying to follow appropriate diet.  Pt denies worsening depressive symptoms, suicidal ideation or panic. No fever, night sweats, wt loss, loss of appetite, or other constitutional symptoms.  Pt states good ability with ADL's, has low fall risk, home safety reviewed and adequate, no other significant changes in hearing or vision, and only occasionally active with exercise. Has appt with GYN soon. Has occasional constipation, takes otc stool softner.  No other interval hx or new complaint  Denies worsening depressive symptoms, suicidal ideation, or panic; has ongoing anxiety, asks for counseling referral Past Medical History:  Diagnosis Date  . Asthma    Childhood   No past surgical history on file.  reports that she has never smoked. She has never used smokeless tobacco. She reports that she does not drink alcohol or use drugs. family history includes Hypertension in her mother. Allergies  Allergen Reactions  . Peanut-Containing Drug Products Anaphylaxis   No current outpatient medications on file prior to visit.   No current facility-administered medications on file prior to visit.    Review of Systems Constitutional: Negative for other unusual diaphoresis, sweats, appetite or weight changes HENT: Negative for other worsening hearing loss, ear pain, facial swelling, mouth sores or neck stiffness.   Eyes: Negative for other worsening pain, redness or other visual disturbance.  Respiratory:  Negative for other stridor or swelling Cardiovascular: Negative for other palpitations or other chest pain  Gastrointestinal: Negative for worsening diarrhea or loose stools, blood in stool, distention or other pain Genitourinary: Negative for hematuria, flank pain or other change in urine volume.  Musculoskeletal: Negative for myalgias or other joint swelling.  Skin: Negative for other color change, or other wound or worsening drainage.  Neurological: Negative for other syncope or numbness. Hematological: Negative for other adenopathy or swelling Psychiatric/Behavioral: Negative for hallucinations, other worsening agitation, SI, self-injury, or new decreased concentration All other system neg per pt    Objective:   Physical Exam BP 114/76   Pulse 68   Temp 98.6 F (37 C) (Oral)   Ht  (1.753 m)   Wt 142 lb (64.4 kg)   SpO2 98%   BMI 20.97 kg/m  VS noted,  Constitutional: Pt is oriented to person, place, and time. Appears well-developed and well-nourished, in no significant distress and comfortable Head: Normocephalic and atraumatic  Eyes: Conjunctivae and EOM are normal. Pupils are equal, round, and reactive to light Right Ear: External ear normal without discharge Left Ear: External ear normal without discharge Nose: Nose without discharge or deformity Mouth/Throat: Oropharynx is without other ulcerations and moist  Neck: Normal range of motion. Neck supple. No JVD present. No tracheal deviation present or significant neck LA or mass Cardiovascular: Normal rate, regular rhythm, normal heart sounds and intact distal pulses.   Pulmonary/Chest: WOB normal and breath sounds without rales or wheezing  Abdominal: Soft. Bowel sounds are normal. NT. No HSM  Musculoskeletal: Normal range of motion. Exhibits no edema Lymphadenopathy: Has no other cervical adenopathy.  Neurological:  Pt is alert and oriented to person, place, and time. Pt has normal reflexes. No cranial nerve deficit.  Motor grossly intact, Gait intact Skin: Skin is warm and dry. No rash noted or new ulcerations Psychiatric:  Has nervous mood and affect. Behavior is normal without agitation No other exam findings    Assessment & Plan:

## 2018-05-04 LAB — HIV ANTIBODY (ROUTINE TESTING W REFLEX): HIV 1&2 Ab, 4th Generation: NONREACTIVE

## 2018-05-05 NOTE — Assessment & Plan Note (Signed)

## 2018-05-05 NOTE — Assessment & Plan Note (Signed)
Ok for counseling referral

## 2018-05-09 ENCOUNTER — Telehealth: Payer: Self-pay | Admitting: Internal Medicine

## 2018-05-09 NOTE — Telephone Encounter (Signed)
Medication: Epi-Pen Last OV:05/03/18 Last refill:01/13/17 (expired) JYN:WGNF Pharmacy: [facility] [address] [place] - [address] [phone] (Phone) [phone] (Fax)

## 2018-05-09 NOTE — Telephone Encounter (Signed)
Copied from CRM [id number]. Topic: Quick Communication - Rx Refill/Question >> May 09, 2018  1:39 PM Arlyss Gandy, NT wrote: Medication: Epi-Pen  Has the patient contacted their pharmacy? Yes.   (Agent: If no, request that the patient contact the pharmacy for the refill.) (Agent: If yes, when and what did the pharmacy advise?)  Preferred Pharmacy (with phone number or street name): [facility] [address] [place] - [address] [phone] (Phone) [phone] (Fax)      Agent: Please be advised that RX refills may take up to 3 business days. We ask that you follow-up with your pharmacy.

## 2018-05-10 MED ORDER — EPINEPHRINE 0.3 MG/0.3ML IJ SOAJ: 0.3000 mg | Freq: Once | INTRAMUSCULAR | 1 refills | Status: DC

## 2018-05-10 NOTE — Telephone Encounter (Signed)
Ok, done to [employer]

## 2018-05-10 NOTE — Telephone Encounter (Signed)
Epi-Pen has never been rx. Pls advise if ok to send.Marland KitchenRaechel Chute

## 2018-05-11 MED ORDER — EPINEPHRINE 0.3 MG/0.3ML IJ SOAJ: 0.3000 mg | Freq: Once | INTRAMUSCULAR | 1 refills | Status: AC

## 2018-05-11 NOTE — Telephone Encounter (Signed)
Notified pt rx has been sent to [facility] pharmacy. Pt  States she is not in school and requested rx to be sent to [facility] in [place]. Inform pt will resend.Marland KitchenRaechel Chute

## 2018-11-01 ENCOUNTER — Ambulatory Visit: Payer: Self-pay

## 2018-11-01 NOTE — Telephone Encounter (Signed)
Pt. Reports she started her period today and has very bad cramps. "I usually don't cramp this bad." Has nausea as well. Will take Motrin, try heating pad and stay hydrated. Instructed not to take Motrin on an empty stomach. Instructed if symptoms worsen to call back. Verbalizes understanding.  Reason for Disposition . Normal menstrual cramps  Answer Assessment - Initial Assessment Questions 1. LOCATION: "Where does it hurt?"      Low abdomen 2. ONSET: "When did this episode of pain begin?"       Started today 3. SEVERITY: "How bad is the pain?" "Are you missing school or work because of the pain?"  (e.g., Scale 1-10; mild, moderate, or severe)   - MILD (1-3): doesn't interfere with normal activities, lasting 1-2 days    - MODERATE (4-7): interferes with normal activities (missing work or school), lasting 2-3 days, some associated GI symptoms    - SEVERE (8-10): excruciating pain, lasting 2-7 days, associated GI symptoms, pain radiating into thighs and back     Moderate 4. VAGINAL BLEEDING: "Describe the bleeding that you are having." "How much bleeding is there?"    - SPOTTING: spotting, or pinkish / brownish mucous discharge; does not fill panti-liner or pad    - MILD:  less than 1 pad / hour; less than patient's usual menstrual bleeding   - MODERATE: 1-2 pads / hour; small-medium blood clots (e.g., pea, grape, small coin)    - SEVERE: soaking 2 or more pads/hour for 2 or more hours; bleeding not contained by pads or continuous red blood from vagina; large blood clots (e.g., golf ball, large coin)       Spotting - light 5. MENSTRUAL HISTORY:  "When did this menstrual period begin?", "Is this a normal period for you?"       Usually does not cramp this bad 6. LMP:  "When did your last menstrual period begin?"      Today 7. OTHER SYMPTOMS: "What other symptoms are you having with the pain?" (e.g., fever, dizzy/lighthead, vomiting, diarrhea, vaginal discharge)     Nausea 8. PREGNANCY: "Is  there any chance you are pregnant?" (e.g., unprotected intercourse, missed birth control pill, broken condom)     No  Protocols used: ABDOMINAL PAIN - MENSTRUAL CRAMPS-A-AH

## 2019-01-08 ENCOUNTER — Ambulatory Visit: Payer: BC Managed Care – PPO | Admitting: Internal Medicine

## 2019-01-08 ENCOUNTER — Encounter: Payer: Self-pay | Admitting: Internal Medicine

## 2019-01-08 VITALS — BP 108/74 | HR 60 | Temp 98.4°F | Ht 69.0 in | Wt 135.0 lb

## 2019-01-08 DIAGNOSIS — F411 Generalized anxiety disorder: Secondary | ICD-10-CM

## 2019-01-08 DIAGNOSIS — Z0000 Encounter for general adult medical examination without abnormal findings: Secondary | ICD-10-CM | POA: Diagnosis not present

## 2019-01-08 NOTE — Assessment & Plan Note (Signed)
High suspicion for bipolar, for psychiatry referral

## 2019-01-08 NOTE — Assessment & Plan Note (Signed)

## 2019-01-08 NOTE — Progress Notes (Signed)
Subjective:    Patient ID: Grace Munoz, female    DOB: 1996/04/13, 23 y.o.   MRN: [id number]  HPI  Here for wellness and f/u;  Overall doing ok;  Pt denies Chest pain, worsening SOB, DOE, wheezing, orthopnea, PND, worsening LE edema, palpitations, dizziness or syncope.  Pt denies neurological change such as new headache, facial or extremity weakness.  Pt denies polydipsia, polyuria, or low sugar symptoms. Pt states overall good compliance with treatment and medications, good tolerability, and has been trying to follow appropriate diet.  No fever, night sweats, wt loss, loss of appetite, or other constitutional symptoms.  Pt states good ability with ADL's, has low fall risk, home safety reviewed and adequate, no other significant changes in hearing or vision, and occasionally active with exercise.  Denies worsening depressive symptoms, suicidal ideation, or panic; but has ongoing anxiety occasional low moods, worse recently with "weird irrational thoughts", asks for psychiatric referral Past Medical History:  Diagnosis Date  . Asthma    Childhood   No past surgical history on file.  reports that she has never smoked. She has never used smokeless tobacco. She reports that she does not drink alcohol or use drugs. family history includes Hypertension in her mother. Allergies  Allergen Reactions  . Peanut-Containing Drug Products Anaphylaxis   No current outpatient medications on file prior to visit.   No current facility-administered medications on file prior to visit.    Review of Systems Constitutional: Negative for other unusual diaphoresis, sweats, appetite or weight changes HENT: Negative for other worsening hearing loss, ear pain, facial swelling, mouth sores or neck stiffness.   Eyes: Negative for other worsening pain, redness or other visual disturbance.  Respiratory: Negative for other stridor or swelling Cardiovascular: Negative for other palpitations or other chest pain    Gastrointestinal: Negative for worsening diarrhea or loose stools, blood in stool, distention or other pain Genitourinary: Negative for hematuria, flank pain or other change in urine volume.  Musculoskeletal: Negative for myalgias or other joint swelling.  Skin: Negative for other color change, or other wound or worsening drainage.  Neurological: Negative for other syncope or numbness. Hematological: Negative for other adenopathy or swelling Psychiatric/Behavioral: Negative for hallucinations, other worsening agitation, SI, self-injury, or new decreased concentration All other system neg per pt    Objective:   Physical Exam BP 108/74   Pulse 60   Temp 98.4 F (36.9 C) (Oral)   Ht 5\' 9"  (1.753 m)   Wt 135 lb (61.2 kg)   SpO2 93%   BMI 19.94 kg/m  VS noted,  Constitutional: Pt is oriented to person, place, and time. Appears well-developed and well-nourished, in no significant distress and comfortable Head: Normocephalic and atraumatic  Eyes: Conjunctivae and EOM are normal. Pupils are equal, round, and reactive to light Right Ear: External ear normal without discharge Left Ear: External ear normal without discharge Nose: Nose without discharge or deformity Mouth/Throat: Oropharynx is without other ulcerations and moist  Neck: Normal range of motion. Neck supple. No JVD present. No tracheal deviation present or significant neck LA or mass Cardiovascular: Normal rate, regular rhythm, normal heart sounds and intact distal pulses.   Pulmonary/Chest: WOB normal and breath sounds without rales or wheezing  Abdominal: Soft. Bowel sounds are normal. NT. No HSM  Musculoskeletal: Normal range of motion. Exhibits no edema Lymphadenopathy: Has no other cervical adenopathy.  Neurological: Pt is alert and oriented to person, place, and time. Pt has normal reflexes. No cranial nerve deficit. Motor  grossly intact, Gait intact Skin: Skin is warm and dry. No rash noted or new  ulcerations Psychiatric:  Has nervous mood and affect. Behavior is normal without agitation No other exam findings  Lab Results  Component Value Date   WBC 4.1 05/03/2018   HGB 12.7 05/03/2018   HCT 38.7 05/03/2018   PLT 305.0 05/03/2018   GLUCOSE 72 05/03/2018   CHOL 149 05/03/2018   TRIG 50.0 05/03/2018   HDL 68.60 05/03/2018   LDLCALC 70 05/03/2018   ALT 12 05/03/2018   AST 20 05/03/2018   NA 137 05/03/2018   K 4.1 05/03/2018   CL 102 05/03/2018   CREATININE 0.87 05/03/2018   BUN 12 05/03/2018   CO2 29 05/03/2018   TSH 1.27 05/03/2018       Assessment & Plan:

## 2019-01-08 NOTE — Patient Instructions (Signed)
You will be contacted regarding the referral for: Psychiatry  Please continue all other medications as before, and refills have been done if requested.  Please have the pharmacy call with any other refills you may need.  Please continue your efforts at being more active, low cholesterol diet, and weight control.  You are otherwise up to date with prevention measures today.  Please keep your appointments with your specialists as you may have planned  Please return in 1 year for your yearly visit, or sooner if needed

## 2019-01-30 ENCOUNTER — Telehealth: Payer: Self-pay

## 2019-01-30 NOTE — Telephone Encounter (Signed)
Unable to leave msg. Will try again later. Pt can call them at [phone]

## 2019-01-30 NOTE — Telephone Encounter (Signed)
Copied from CRM [id number]. Topic: Referral - Status >> Jan 29, 2019  4:26 PM Grace Munoz wrote: Reason for CRM: Pt was seen on 01.21.2020 to discuss referral for mental health. Pt called to check the status of her referral/ please advise

## 2019-01-31 NOTE — Telephone Encounter (Signed)
Spoke to pt and gave her behavioral health phone #

## 2019-04-16 ENCOUNTER — Ambulatory Visit: Payer: Self-pay

## 2019-04-16 NOTE — Telephone Encounter (Signed)
Pt. called to report 3-4 diarrhea stools and vomiting several times yesterday.  C/o intermittent abdominal cramping.  Reported no fever or chills.  Stools are watery with brown flecks. Denied blood in stools.  Vomited approx. 4-5 times yesterday; last emesis was about 9:00 PM, 4/27.  Today, has had diarrhea x 1.  Drinking bottled water; has had 2 bottles of water in past 24 hrs.  Denied weakness, dizziness, or decreased urine output. Reported she took one dose of Adderall XL 15 mg. yesterday, that is not prescribed to her, so she could get a project done.  She stated her symptoms started a few hours after the Adderall dose. Stated "I never want to take another one."  Reported she has not been exposed to anyone with similar sx's.  Reported she does not recall eating anything she questioned was spoiled.  Ate [place] [employer] from [employer] on Sun., and a home cooked meal that her father made, also on Sunday.  Denied any recent travel outside country.  Reported she feels much better, compared to yesterday.  Has not taken anything OTC for diarrhea.  Home care advice given per protocol.  Verb. Understanding.  Stated she has Pepto Bismol tablets for children, and questioned about correct adult dosage; advised she should check with Pharmacist for direction on correct dosage.  Encouraged to call back if symptoms worsen or don't improve.  Questions answered.  Agreed with plan.     Reason for Disposition . MILD-MODERATE diarrhea (e.g., 1-6 times / day more than normal)  Answer Assessment - Initial Assessment Questions 1. DIARRHEA SEVERITY: "How bad is the diarrhea?" "How many extra stools have you had in the past 24 hours than normal?"    - NO DIARRHEA (SCALE 0)   - MILD (SCALE 1-3): Few loose or mushy BMs; increase of 1-3 stools over normal daily number of stools; mild increase in ostomy output.   -  MODERATE (SCALE 4-7): Increase of 4-6 stools daily over normal; moderate increase in ostomy output. * SEVERE (SCALE  8-10; OR 'WORST POSSIBLE'): Increase of 7 or more stools daily over normal; moderate increase in ostomy output; incontinence.     Moderate;  2. ONSET: "When did the diarrhea begin?"      Yesterday 3. BM CONSISTENCY: "How loose or watery is the diarrhea?"      Loose,watery with flecks  4. VOMITING: "Are you also vomiting?" If so, ask: "How many times in the past 24 hours?"      Vomited 4-5 times yesterday; last vomited last night about 9:00-10:00 PM 5. ABDOMINAL PAIN: "Are you having any abdominal pain?" If yes: "What does it feel like?" (e.g., crampy, dull, intermittent, constant)      Denied pain; stomach feels a little crampy that comes and goes ; "mild"  6. ABDOMINAL PAIN SEVERITY: If present, ask: "How bad is the pain?"  (e.g., Scale 1-10; mild, moderate, or severe)   - MILD (1-3): doesn't interfere with normal activities, abdomen soft and not tender to touch    - MODERATE (4-7): interferes with normal activities or awakens from sleep, tender to touch    - SEVERE (8-10): excruciating pain, doubled over, unable to do any normal activities       Denied pain 7. ORAL INTAKE: If vomiting, "Have you been able to drink liquids?" "How much fluids have you had in the past 24 hours?"     2 bottles (16 oz) over 24 hrs  8. HYDRATION: "Any signs of dehydration?" (e.g., dry mouth [not  just dry lips], too weak to stand, dizziness, new weight loss) "When did you last urinate?"     Denied dry mouth, weakness or dizziness, and decreased urine output 9. EXPOSURE: "Have you traveled to a foreign country recently?" "Have you been exposed to anyone with diarrhea?" "Could you have eaten any food that was spoiled?"     Denied travel outside country; denied eating spoiled food 10. ANTIBIOTIC USE: "Are you taking antibiotics now or have you taken antibiotics in the past 2 months?"       denied 11. OTHER SYMPTOMS: "Do you have any other symptoms?" (e.g., fever, blood in stool)      Some abdominal cramps 12.  PREGNANCY: "Is there any chance you are pregnant?" "When was your last menstrual period?"       LMP; no chance of pregnancy; last cycle 03/23/19  Protocols used: [facility]

## 2019-05-09 ENCOUNTER — Telehealth: Payer: Self-pay

## 2019-05-09 DIAGNOSIS — F419 Anxiety disorder, unspecified: Secondary | ICD-10-CM

## 2019-05-09 NOTE — Addendum Note (Signed)
Addended by: Corwin Levins on: 05/09/2019 06:26 PM   Modules accepted: Orders

## 2019-05-09 NOTE — Telephone Encounter (Signed)
Copied from CRM [id number]. Topic: Referral - Request for Referral >> May 09, 2019  3:16 PM Laural Benes, [place] C wrote: Pt's mother called in to request a referral to a different therapist. They said that PCP set them up with someone but that person didn't work well for them.  They (mother & pt)  would like to see [facility] fax: [phone] Phone: [phone]   CB: [phone]

## 2019-05-09 NOTE — Telephone Encounter (Signed)
Ok this is done 
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
## 2019-05-27 ENCOUNTER — Other Ambulatory Visit: Payer: Self-pay

## 2019-05-27 ENCOUNTER — Ambulatory Visit (INDEPENDENT_AMBULATORY_CARE_PROVIDER_SITE_OTHER): Payer: BC Managed Care – PPO | Admitting: Psychiatry

## 2019-05-27 ENCOUNTER — Encounter (HOSPITAL_COMMUNITY): Payer: Self-pay | Admitting: Psychiatry

## 2019-05-27 DIAGNOSIS — F4322 Adjustment disorder with anxiety: Secondary | ICD-10-CM | POA: Insufficient documentation

## 2019-05-27 NOTE — Patient Instructions (Signed)
1. Referral to therapy  2. Next appointment: 7/6 at 1 PM

## 2019-06-10 ENCOUNTER — Encounter: Payer: Self-pay | Admitting: Internal Medicine

## 2019-06-10 ENCOUNTER — Telehealth: Payer: Self-pay | Admitting: *Deleted

## 2019-06-10 ENCOUNTER — Ambulatory Visit (INDEPENDENT_AMBULATORY_CARE_PROVIDER_SITE_OTHER): Payer: BC Managed Care – PPO | Admitting: Internal Medicine

## 2019-06-10 DIAGNOSIS — F411 Generalized anxiety disorder: Secondary | ICD-10-CM | POA: Diagnosis not present

## 2019-06-10 DIAGNOSIS — Z20822 Contact with and (suspected) exposure to covid-19: Secondary | ICD-10-CM

## 2019-06-10 DIAGNOSIS — J069 Acute upper respiratory infection, unspecified: Secondary | ICD-10-CM | POA: Diagnosis not present

## 2019-06-10 NOTE — Progress Notes (Signed)
Patient ID: Grace Munoz, female   DOB: January 26, 1996, 23 y.o.   MRN: [id number]  Virtual Visit via Video Note  I connected with Grace Munoz on 06/10/19 at  3:00 PM EDT by a video enabled telemedicine application and verified that I am speaking with the correct person using two identifiers.  Location: Patient: at home Provider: at office   I discussed the limitations of evaluation and management by telemedicine and the availability of in person appointments. The patient expressed understanding and agreed to proceed.  History of Present Illness:  Here with 2-3 days acute onset fever, facial pain, pressure, headache, general weakness and malaise, and clearish d/c, without ST or cough, and pt denies chest pain, wheezing, increased sob or doe, orthopnea, PND, increased LE swelling, palpitations, dizziness or syncope.Denies worsening depressive symptoms, suicidal ideation, or panic; has ongoing anxiety Past Medical History:  Diagnosis Date  . Asthma    Childhood   No past surgical history on file.  reports that she has never smoked. She has never used smokeless tobacco. She reports that she does not drink alcohol or use drugs. family history includes Hypertension in her mother. Allergies  Allergen Reactions  . Peanut-Containing Drug Products Anaphylaxis   No current outpatient medications on file prior to visit.   No current facility-administered medications on file prior to visit.     Observations/Objective: Alert, NAD, mild ill appearing, appropriate mood and affect, resps normal, cn 2-12 intact, moves all 4s, no visible rash or swelling Lab Results  Component Value Date   WBC 4.1 05/03/2018   HGB 12.7 05/03/2018   HCT 38.7 05/03/2018   PLT 305.0 05/03/2018   GLUCOSE 72 05/03/2018   CHOL 149 05/03/2018   TRIG 50.0 05/03/2018   HDL 68.60 05/03/2018   LDLCALC 70 05/03/2018   ALT 12 05/03/2018   AST 20 05/03/2018   NA 137 05/03/2018   K 4.1 05/03/2018   CL 102 05/03/2018   CREATININE 0.87 05/03/2018   BUN 12 05/03/2018   CO2 29 05/03/2018   TSH 1.27 05/03/2018   Assessment and Plan: See notes  Follow Up Instructions: See notes   I discussed the assessment and treatment plan with the patient. The patient was provided an opportunity to ask questions and all were answered. The patient agreed with the plan and demonstrated an understanding of the instructions.   The patient was advised to call back or seek an in-person evaluation if the symptoms worsen or if the condition fails to improve as anticipated.   Cathlean Cower, MD

## 2019-06-10 NOTE — Telephone Encounter (Signed)
-----   Message from Cresenciano Lick, [place] sent at 06/10/2019  4:04 PM EDT ----- Regarding: COVID19 Patient needs COVID19 testing per Dr. Jenny Reichmann for fever and cough. Thanks!

## 2019-06-10 NOTE — Assessment & Plan Note (Signed)
Mild, c/w likely viral illness, for otc sudafed prn, also refer pt for COVID testing,  to f/u any worsening symptoms or concerns

## 2019-06-10 NOTE — Telephone Encounter (Signed)
Scheduled patient for COVID 19 test tomorrow at [facility] building at 9:45 am.  Testing protocol reviewed with patient, she expressed understanding.

## 2019-06-10 NOTE — Patient Instructions (Signed)
Please continue all other medications as before, and refills have been done if requested.  Please have the pharmacy call with any other refills you may need.  Please keep your appointments with your specialists as you may have planned  You will be referred for COVID testing, and should expect a call from a rep from the [facility]

## 2019-06-10 NOTE — Assessment & Plan Note (Signed)
stable overall by history and exam, recent data reviewed with pt, and pt to continue medical treatment as before,  to f/u any worsening symptoms or concerns  

## 2019-06-11 ENCOUNTER — Other Ambulatory Visit: Payer: Self-pay

## 2019-06-11 DIAGNOSIS — Z20822 Contact with and (suspected) exposure to covid-19: Secondary | ICD-10-CM

## 2019-06-14 ENCOUNTER — Telehealth: Payer: Self-pay | Admitting: Internal Medicine

## 2019-06-14 LAB — NOVEL CORONAVIRUS, NAA: SARS-CoV-2, NAA: DETECTED — AB

## 2019-06-14 NOTE — Telephone Encounter (Signed)
Please inform pt, and let her know that she should go to the ED for any worsening fever, cough and especially sob

## 2019-06-14 NOTE — Telephone Encounter (Signed)
[facility] called to report that pt was positive for covid19. Please advise.

## 2019-06-14 NOTE — Telephone Encounter (Signed)
Attempted to call pt, received VM. Left msg for pt to call back to discuss.

## 2019-06-14 NOTE — Telephone Encounter (Signed)
Pt has called back and was informed of positive tst result and to quarantine for a minimum of 14 days.

## 2019-06-14 NOTE — Telephone Encounter (Signed)
Pt called in for Covid-19 results. Pt requests call back.

## 2019-06-17 NOTE — Progress Notes (Signed)
Virtual Visit via Video Note  I connected with Grace Munoz on 06/24/19 at  4:30 PM EDT by a video enabled telemedicine application and verified that I am speaking with the correct person using two identifiers.   I discussed the limitations of evaluation and management by telemedicine and the availability of in person appointments. The patient expressed understanding and agreed to proceed.     I discussed the assessment and treatment plan with the patient. The patient was provided an opportunity to ask questions and all were answered. The patient agreed with the plan and demonstrated an understanding of the instructions.   The patient was advised to call back or seek an in-person evaluation if the symptoms worsen or if the condition fails to improve as anticipated.  I provided 25 minutes of non-face-to-face time during this encounter.   Grace Hottereina Zanita Millman, MD     [facility] OP Progress Note  06/24/2019 4:43 PM Grace Munoz  MRN:  [id number]  Chief Complaint:  Chief Complaint    Anxiety; Follow-up     HPI:  This is a follow-up appointment for anxiety.  She states that she has been struggling with anxiety.  She talks about her family, who wants the patient to be employed.  She tends to have "negative thoughts about where I am in my life." She feels that she is not doing anything or accomplished anything. She refers to text messages she receives from her grandparents, stating that she feels like she is letting people down. She states that she tends to procrastinate and sabotage things. She continues to that she does not know what she wants to become in the future. After redirection, she states that she was able to do assignments, although she may need to re-submit it after getting review. She is hoping to reapply for graduation. She has started reading books, and yoga. She denies insomnia. She denies anhedonia and has fair energy. She has difficulty in concentration.  Although she wants to run away  from things, she denies any intent/plan as she is scared of death. She feels anxious, tense.  She tends to feel irritable and apologize to her family about her attitude later.  She tends to pace around in the house.  She denies panic attacks. She has not used marijuana since June 19 th. She believes that she will be doing fine and "just need to learn coping skills" to handle her anxiety. She shared her concern of being "dependent on medication (antidepressant)"; provided psycho education; she is receptive to the information.      Visit Diagnosis:    ICD-10-CM   1. Adjustment disorder with anxious mood  F43.22     Past Psychiatric History: Please see initial evaluation for full details. I have reviewed the history. No updates at this time.     Past Medical History:  Past Medical History:  Diagnosis Date  . Asthma    Childhood   No past surgical history on file.  Family Psychiatric History: Please see initial evaluation for full details. I have reviewed the history. No updates at this time.     Family History:  Family History  Problem Relation Age of Onset  . Hypertension Mother     Social History:  Social History   Socioeconomic History  . Marital status: Single    Spouse name: Not on file  . Number of children: Not on file  . Years of education: Not on file  . Highest education level: Not on file  Occupational  History  . Not on file  Social Needs  . Financial resource strain: Not on file  . Food insecurity    Worry: Not on file    Inability: Not on file  . Transportation needs    Medical: Not on file    Non-medical: Not on file  Tobacco Use  . Smoking status: Never Smoker  . Smokeless tobacco: Never Used  Substance and Sexual Activity  . Alcohol use: No  . Drug use: No  . Sexual activity: Not on file  Lifestyle  . Physical activity    Days per week: Not on file    Minutes per session: Not on file  . Stress: Not on file  Relationships  . Social Product manager on phone: Not on file    Gets together: Not on file    Attends religious service: Not on file    Active member of club or organization: Not on file    Attends meetings of clubs or organizations: Not on file    Relationship status: Not on file  Other Topics Concern  . Not on file  Social History Narrative  . Not on file    Allergies:  Allergies  Allergen Reactions  . Peanut-Containing Drug Products Anaphylaxis    Metabolic Disorder Labs: No results found for: HGBA1C, MPG No results found for: PROLACTIN Lab Results  Component Value Date   CHOL 149 05/03/2018   TRIG 50.0 05/03/2018   HDL 68.60 05/03/2018   CHOLHDL 2 05/03/2018   VLDL 10.0 05/03/2018   LDLCALC 70 05/03/2018   LDLCALC 58 01/13/2017   Lab Results  Component Value Date   TSH 1.27 05/03/2018   TSH 1.78 01/13/2017    Therapeutic Level Labs: No results found for: LITHIUM No results found for: VALPROATE No components found for:  CBMZ  Current Medications: No current outpatient medications on file.   No current facility-administered medications for this visit.      Musculoskeletal: Strength & Muscle Tone: N/A Gait & Station: N/A Patient leans: N/A  Psychiatric Specialty Exam: Review of Systems  Psychiatric/Behavioral: Positive for suicidal ideas. Negative for depression, hallucinations, memory loss and substance abuse. The patient is nervous/anxious. The patient does not have insomnia.   All other systems reviewed and are negative.   There were no vitals taken for this visit.There is no height or weight on file to calculate BMI.  General Appearance: Fairly Groomed  Eye Contact:  Good  Speech:  Clear and Coherent  Volume:  Normal  Mood:  Anxious  Affect:  Appropriate, Congruent and Restricted, tense- later smiles  Thought Process:  Coherent  Orientation:  Full (Time, Place, and Person)  Thought Content: Logical   Suicidal Thoughts:  No  Homicidal Thoughts:  No  Memory:  Immediate;    Good  Judgement:  Good  Insight:  Fair  Psychomotor Activity:  Normal  Concentration:  Concentration: Fair and Attention Span: Fair  Recall:  Good  Fund of Knowledge: Good  Language: Good  Akathisia:  No  Handed:  Right  AIMS (if indicated): not done  Assets:  Communication Skills Desire for Improvement  ADL's:  Intact  Cognition: WNL  Sleep:  Good   Screenings: PHQ2-9     Office Visit from 05/03/2018 in [place]  PHQ-2 Total Score  0       Assessment and Plan:  Dierra Lazenby is a 23 y.o. year old female with a history of depression, who presents  for follow up appointment for anxiety.   # Adjustment disorder with anxiety # r/o GAD Exam is notable for tense affect, and patient continues to struggle with anxiety since her last visit.  Psychosocial stressors includes unemployment, college, loss of her best friend's boyfriend, and conflict with her family. Other psychosocial stressors includes some trauma like history in college.   Although she will likely benefit from antidepressant given the severity of her anxiety, she has strong preference to try therapy first.  Provided psychoeducation about antidepressant if she were to be interested in the future.  Discussed cognitive defusion.  She will greatly benefit from CBT; she is encouraged to keep the appointment tomorrow with her therapist.   # Marijuana use She has not used marijuana since June (used to smoke every day before [place] She is motivated for abstinence. Will continue motivational interview.   Plan 1. Referral to therapy; appointment tomorrow  2. Next appointment: 8/11 at 11 AM for 30 mins, video  I have reviewed suicide assessment in detail. No change in the following assessment.   The patient demonstrates the following risk factors for suicide: Chronic risk factors for suicide include: psychiatric disorder of anxiety. Acute risk factors for suicide include: unemployment.  Protective factors for this patient include: positive social support, coping skills and hope for the future. Considering these factors, the overall suicide risk at this point appears to be low. Patient is appropriate for outpatient follow up.  Grace Hottereina Vanesha Athens, MD 06/24/2019, 4:43 PM

## 2019-06-24 ENCOUNTER — Telehealth: Payer: Self-pay | Admitting: *Deleted

## 2019-06-24 ENCOUNTER — Encounter (HOSPITAL_COMMUNITY): Payer: Self-pay | Admitting: Psychiatry

## 2019-06-24 ENCOUNTER — Ambulatory Visit (INDEPENDENT_AMBULATORY_CARE_PROVIDER_SITE_OTHER): Payer: BC Managed Care – PPO | Admitting: Psychiatry

## 2019-06-24 ENCOUNTER — Ambulatory Visit (HOSPITAL_COMMUNITY): Payer: BC Managed Care – PPO | Admitting: Psychiatry

## 2019-06-24 ENCOUNTER — Other Ambulatory Visit: Payer: Self-pay

## 2019-06-24 DIAGNOSIS — F4322 Adjustment disorder with anxiety: Secondary | ICD-10-CM | POA: Diagnosis not present

## 2019-06-24 NOTE — Patient Instructions (Addendum)
1. Referral to therapy; appointment tomorrow  2. Next appointment: 8/11 at 11 AM  3. CONTACT INFORMATION  What to do if you need to get in touch with someone regarding a psychiatric issue:  1. EMERGENCY: For psychiatric emergencies (if you are suicidal or if there are any other safety issues) call 911 and/or go to your nearest Emergency Room immediately.   2. IF YOU NEED SOMEONE TO TALK TO RIGHT NOW: Given my clinical responsibilities, I may not be able to speak with you over the phone for a prolonged period of time.  a. You may always call The National Suicide Prevention Lifeline at 1-800-273-TALK 253-416-0219).

## 2019-06-24 NOTE — Telephone Encounter (Signed)
Pt has been informed and expressed understanding.  

## 2019-06-24 NOTE — Telephone Encounter (Signed)
Ok to return to work after the 14 days, no further testing is needed

## 2019-06-24 NOTE — Telephone Encounter (Signed)
Patient is calling to report that she is nearing her 14 day isolation for positive COVID. Patient wants to know if she needs to be tested again or if she is OK to end isolation without being tested. Reviewed the non-test criteria for ending isolation with patient and she will await PCP advisement. Patient states her symptoms are resolved.

## 2019-06-25 ENCOUNTER — Ambulatory Visit (HOSPITAL_COMMUNITY): Payer: BC Managed Care – PPO | Admitting: Psychiatry

## 2019-07-01 ENCOUNTER — Telehealth: Payer: Self-pay | Admitting: Internal Medicine

## 2019-07-01 NOTE — Telephone Encounter (Signed)
Pt states tested positive for covid 19  three  weeks ago. States all symptoms resolved except   "Spitting up a lot of mucous past few days."  States mucous is yellowish. Denies fever, no cough, no sinus tenderness. Pt states "I just wonder if this is serious." Care advise given per protocol. Advised pt to stay hydrated, continue to check temp. And to CB if symptoms worsen or any other symptoms arise. Pt verbalizes understanding.

## 2019-07-26 NOTE — Progress Notes (Signed)
Virtual Visit via Video Note  I connected with Grace Munoz on 07/30/19 at 11:00 AM EDT by a video enabled telemedicine application and verified that I am speaking with the correct person using two identifiers.   I discussed the limitations of evaluation and management by telemedicine and the availability of in person appointments. The patient expressed understanding and agreed to proceed.     I discussed the assessment and treatment plan with the patient. The patient was provided an opportunity to ask questions and all were answered. The patient agreed with the plan and demonstrated an understanding of the instructions.   The patient was advised to call back or seek an in-person evaluation if the symptoms worsen or if the condition fails to improve as anticipated.  I provided 25 minutes of non-face-to-face time during this encounter.   Grace Hottereina Alithea Lapage, MD    [facility] OP Progress Note  07/30/2019 11:37 AM Grace Munoz  MRN:  [id number]  Chief Complaint:  Chief Complaint    Anxiety; Follow-up     HPI:  This is a follow-up appointment for anxiety.  She states that she has been doing good.  However, on further inquiry, she states that she has been anxious around people. 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 She is around with many people.  She tends to get very overwhelmed and anxious, thinking that people are judging the patient. She feels like she "messed up" and had panic attack yesterday. She thought that people think that she is not doing her job. She reports similar episode when she was driving, thinking that other people think that she is not driving right. She feels anxious when she is with her friends as well. She feels excited to be moving to [place] in [place]. She will live with her roommates. 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 She feels relieved that she could finish paper assignment. She has fair sleep.  She denies feeling  depressed.  She has difficulty concentration when she is anxious.  She denies irritability.  She denies SI. She uses marijuana daily to "get high."  Visit Diagnosis:    ICD-10-CM   1. Social anxiety disorder  F40.10     Past Psychiatric History: Please see initial evaluation for full details. I have reviewed the history. No updates at this time.     Past Medical History:  Past Medical History:  Diagnosis Date  . Asthma    Childhood   History reviewed. No pertinent surgical history.  Family Psychiatric History: Please see initial evaluation for full details. I have reviewed the history. No updates at this time.     Family History:  Family History  Problem Relation Age of Onset  . Hypertension Mother     Social History:  Social History   Socioeconomic History  . Marital status: Single    Spouse name: Not on file  . Number of children: Not on file  . Years of education: Not on file  . Highest education level: Not on file  Occupational History  . Not on file  Social Needs  . Financial resource strain: Not on file  . Food insecurity    Worry: Not on file    Inability: Not on file  . Transportation needs    Medical: Not on file    Non-medical: Not on file  Tobacco Use  . Smoking status: Never Smoker  . Smokeless tobacco: Never Used  Substance  and Sexual Activity  . Alcohol use: No  . Drug use: No  . Sexual activity: Not on file  Lifestyle  . Physical activity    Days per week: Not on file    Minutes per session: Not on file  . Stress: Not on file  Relationships  . Social Herbalist on phone: Not on file    Gets together: Not on file    Attends religious service: Not on file    Active member of club or organization: Not on file    Attends meetings of clubs or organizations: Not on file    Relationship status: Not on file  Other Topics Concern  . Not on file  Social History Narrative  . Not on file    Allergies:  Allergies  Allergen  Reactions  . Peanut-Containing Drug Products Anaphylaxis    Metabolic Disorder Labs: No results found for: HGBA1C, MPG No results found for: PROLACTIN Lab Results  Component Value Date   CHOL 149 05/03/2018   TRIG 50.0 05/03/2018   HDL 68.60 05/03/2018   CHOLHDL 2 05/03/2018   VLDL 10.0 05/03/2018   LDLCALC 70 05/03/2018   LDLCALC 58 01/13/2017   Lab Results  Component Value Date   TSH 1.27 05/03/2018   TSH 1.78 01/13/2017    Therapeutic Level Labs: No results found for: LITHIUM No results found for: VALPROATE No components found for:  CBMZ  Current Medications: Current Outpatient Medications  Medication Sig Dispense Refill  . sertraline (ZOLOFT) 50 MG tablet 25 mg at night for one week, then 50 mg at night 30 tablet 1   No current facility-administered medications for this visit.      Musculoskeletal: Strength & Muscle Tone: N/A Gait & Station: N/A Patient leans: N/A  Psychiatric Specialty Exam: Review of Systems  Psychiatric/Behavioral: Negative for depression, hallucinations, memory loss, substance abuse and suicidal ideas. The patient is nervous/anxious. The patient does not have insomnia.   All other systems reviewed and are negative.   There were no vitals taken for this visit.There is no height or weight on file to calculate BMI.  General Appearance: Fairly Groomed  Eye Contact:  Good  Speech:  Clear and Coherent  Volume:  Normal  Mood:  Anxious  Affect:  Appropriate, Congruent, Restricted and Tearful  Thought Process:  Coherent  Orientation:  Full (Time, Place, and Person)  Thought Content: Logical   Suicidal Thoughts:  No  Homicidal Thoughts:  No  Memory:  Immediate;   Good  Judgement:  Good  Insight:  Fair  Psychomotor Activity:  Normal  Concentration:  Concentration: Good and Attention Span: Good  Recall:  Good  Fund of Knowledge: Good  Language: Good  Akathisia:  No  Handed:  Right  AIMS (if indicated): not done  Assets:  Communication  Skills Desire for Improvement  ADL's:  Intact  Cognition: WNL  Sleep:  Good   Screenings: PHQ2-9     Office Visit from 05/03/2018 in [place]  PHQ-2 Total Score  0       Assessment and Plan:  Grace Munoz is a 23 y.o. year old female with a history of depression , who presents for follow up appointment for anxiety.   # social anxiety disorder # r/o GAD Exam is notable for tearfulness and she continues to struggle with anxiety in public since the last visit.  Psychosocial stressors includes conflict with her family, and loss of her best friend's boyfriend.  Other psychosocial stressors includes trauma like history in college.  Will start sertraline to target anxiety.  Discussed potential GI side effect and drowsiness.  Also discussed potential side effect of SI in younger population.  She will greatly benefit from CBT.  Given she has relocated to [place], she is advised to seek both psychiatrist and therapist in the area.   # Marijuana use She uses marijuana daily. She is at pre contemplative stage for marijuana use.  Will continue motivational interviewing.   Plan 1. Start sertraline 25 mg at night for one week, then 50 mg daily  2. Find a local psychiatrist, therapist 3. Next appointment: 9/23 at 10:20 for 30 mins, video  Past trials of medication: denies   The patient demonstrates the following risk factors for suicide: Chronic risk factors for suicide include:psychiatric disorder ofanxiety. Acute risk factorsfor suicide include: unemployment. Protective factorsfor this patient include: positive social support, coping skills and hope for the future. Considering these factors, the overall suicide risk at this point appears to below. Patientisappropriate for outpatient follow up.  The duration of this appointment visit was 25 minutes of non face-to-face time with the patient.  Greater than 50% of this time was spent in counseling, explanation of   diagnosis, planning of further management, and coordination of care.  Grace Hottereina Breezy Hertenstein, MD 07/30/2019, 11:37 AM

## 2019-07-30 ENCOUNTER — Other Ambulatory Visit: Payer: Self-pay

## 2019-07-30 ENCOUNTER — Encounter (HOSPITAL_COMMUNITY): Payer: Self-pay | Admitting: Psychiatry

## 2019-07-30 ENCOUNTER — Ambulatory Visit (INDEPENDENT_AMBULATORY_CARE_PROVIDER_SITE_OTHER): Payer: BC Managed Care – PPO | Admitting: Psychiatry

## 2019-07-30 DIAGNOSIS — F4010 Social phobia, unspecified: Secondary | ICD-10-CM | POA: Diagnosis not present

## 2019-07-30 MED ORDER — SERTRALINE HCL 50 MG PO TABS: ORAL_TABLET | ORAL | 1 refills | Status: DC

## 2019-07-30 NOTE — Patient Instructions (Signed)
1. Start sertraline 25 mg at night for one week, then 50 mg daily  2. Find a local psychiatrist, therapist 3. Next appointment: 9/23 at 10:20

## 2019-09-09 NOTE — Progress Notes (Signed)
Virtual Visit via Video Note  I connected with Grace Munoz on 09/11/19 at 10:20 AM EDT by a video enabled telemedicine application and verified that I am speaking with the correct person using two identifiers.   I discussed the limitations of evaluation and management by telemedicine and the availability of in person appointments. The patient expressed understanding and agreed to proceed.    I discussed the assessment and treatment plan with the patient. The patient was provided an opportunity to ask questions and all were answered. The patient agreed with the plan and demonstrated an understanding of the instructions.   The patient was advised to call back or seek an in-person evaluation if the symptoms worsen or if the condition fails to improve as anticipated.  I provided 25 minutes of non-face-to-face time during this encounter.   Norman Clay, MD    [facility] MD/PA/NP OP Progress Note  09/11/2019 10:40 AM Grace Munoz  MRN:  [id number]  Chief Complaint:  Chief Complaint    Anxiety; Follow-up     HPI:  This is a follow-up appointment for anxiety.  She states that she has moved to [place] in August.  She lives with her best friend (who lost one's boyfriend) and this friend's best friends. She feels comfortable with this transition.  She states that she had to end 1 of her jobs as children are going back to school.  She continues to work at [employer] full-time.  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  She is planning to reach out her family as she is interested in job at [employer]. she needs to reapply for graduation, which is on her to do list. She thinks about her female friend who deceased every day. It "comes like a wave." Although she feels sad about the loss, she is able to get back to herself at the end of the day. She states that she had intense anxiety when she turned down to offer a ride to her co-worker she does not know. She was concerned that how  it would make her look like to other people, although she thinks she made a good decision. She also had intense anxiety when she was with people she does not know as she was afraid of judgement (she becomes tearful when she describes these episodes.)  She has good sleep.  She denies feeling depressed.  She denies SI.  She denies appetite loss.  She feels anxious and tense at times.  She denies irritability.  She denies any side effect from sertraline.   Visit Diagnosis:    ICD-10-CM   1. Adjustment disorder with anxious mood  F43.22   2. Social anxiety disorder  F40.10     Past Psychiatric History: Please see initial evaluation for full details. I have reviewed the history. No updates at this time.     Past Medical History:  Past Medical History:  Diagnosis Date  . Asthma    Childhood   No past surgical history on file.  Family Psychiatric History: Please see initial evaluation for full details. I have reviewed the history. No updates at this time.     Family History:  Family History  Problem Relation Age of Onset  . Hypertension Mother     Social History:  Social History   Socioeconomic History  . Marital status: Single    Spouse name: Not on file  . Number of children: Not on file  . Years of  education: Not on file  . Highest education level: Not on file  Occupational History  . Not on file  Social Needs  . Financial resource strain: Not on file  . Food insecurity    Worry: Not on file    Inability: Not on file  . Transportation needs    Medical: Not on file    Non-medical: Not on file  Tobacco Use  . Smoking status: Never Smoker  . Smokeless tobacco: Never Used  Substance and Sexual Activity  . Alcohol use: No  . Drug use: No  . Sexual activity: Not on file  Lifestyle  . Physical activity    Days per week: Not on file    Minutes per session: Not on file  . Stress: Not on file  Relationships  . Social Musician on phone: Not on file    Gets  together: Not on file    Attends religious service: Not on file    Active member of club or organization: Not on file    Attends meetings of clubs or organizations: Not on file    Relationship status: Not on file  Other Topics Concern  . Not on file  Social History Narrative  . Not on file    Allergies:  Allergies  Allergen Reactions  . Peanut-Containing Drug Products Anaphylaxis    Metabolic Disorder Labs: No results found for: HGBA1C, MPG No results found for: PROLACTIN Lab Results  Component Value Date   CHOL 149 05/03/2018   TRIG 50.0 05/03/2018   HDL 68.60 05/03/2018   CHOLHDL 2 05/03/2018   VLDL 10.0 05/03/2018   LDLCALC 70 05/03/2018   LDLCALC 58 01/13/2017   Lab Results  Component Value Date   TSH 1.27 05/03/2018   TSH 1.78 01/13/2017    Therapeutic Level Labs: No results found for: LITHIUM No results found for: VALPROATE No components found for:  CBMZ  Current Medications: Current Outpatient Medications  Medication Sig Dispense Refill  . sertraline (ZOLOFT) 50 MG tablet 25 mg at night for one week, then 50 mg at night 30 tablet 1   No current facility-administered medications for this visit.      Musculoskeletal: Strength & Muscle Tone: N/A Gait & Station: N/A Patient leans: N/A  Psychiatric Specialty Exam: Review of Systems  Psychiatric/Behavioral: Negative for depression, hallucinations, memory loss, substance abuse and suicidal ideas. The patient is nervous/anxious. The patient does not have insomnia.   All other systems reviewed and are negative.   There were no vitals taken for this visit.There is no height or weight on file to calculate BMI.  General Appearance: Fairly Groomed  Eye Contact:  Good  Speech:  Clear and Coherent  Volume:  Normal  Mood:  "good"  Affect:  Appropriate, Congruent and Tearful  Thought Process:  Coherent  Orientation:  Full (Time, Place, and Person)  Thought Content: Logical   Suicidal Thoughts:  No   Homicidal Thoughts:  No  Memory:  Immediate;   Good  Judgement:  Good  Insight:  Fair  Psychomotor Activity:  Normal  Concentration:  Concentration: Good and Attention Span: Good  Recall:  Good  Fund of Knowledge: Good  Language: Good  Akathisia:  No  Handed:  Right  AIMS (if indicated): not done  Assets:  Communication Skills Desire for Improvement  ADL's:  Intact  Cognition: WNL  Sleep:  Good   Screenings: PHQ2-9     Office Visit from 05/03/2018 in [facility]  [place]  PHQ-2 Total Score  0       Assessment and Plan:  Mikeila Mehra is a 23 y.o. year old female with a history of depression , who presents for follow up appointment for Adjustment disorder with anxious mood  Social anxiety disorder  # Social anxiety disorder # r/o GAD Exam is notable for tearfulness which she describes anxiety, which has been consistent since initial interview.  There has been overall improvement in anxiety since starting sertraline.  Psychosocial stressors increase loss of her best friend's boyfriend, and she also reports conflict with her family in the past.  Other psychosocial stressors includes trauma like history in college.  Will continue sertraline to target anxiety.  Discussed potential side effect of SI in younger population.  She will greatly benefit from CBT; she is advised again to find a local psychiatrist/therapist in the area.   # Marijuana use She has not used marijuana since 9/4. Will continue motivational interview.   Plan I have reviewed and updated plans as below 1. Continue sertraline 50 mg daily  2. Find a local psychiatrist, therapist 3. Next appointment: 11/6 at 9:20 for 30 mins, video [address], [place], [place], [place], [address]  Past trials of medication:denies  The patient demonstrates the following risk factors for suicide: Chronic risk factors for suicide include:psychiatric disorder ofanxiety. Acute risk factorsfor suicide include:  unemployment. Protective factorsfor this patient include: positive social support, coping skills and hope for the future. Considering these factors, the overall suicide risk at this point appears to below. Patientisappropriate for outpatient follow up.  The duration of this appointment visit was 25 minutes of non face-to-face time with the patient.  Greater than 50% of this time was spent in counseling, explanation of  diagnosis, planning of further management, and coordination of care.  Neysa Hotter, MD 09/11/2019, 10:40 AM

## 2019-09-11 ENCOUNTER — Other Ambulatory Visit: Payer: Self-pay

## 2019-09-11 ENCOUNTER — Encounter (HOSPITAL_COMMUNITY): Payer: Self-pay | Admitting: Psychiatry

## 2019-09-11 ENCOUNTER — Ambulatory Visit (INDEPENDENT_AMBULATORY_CARE_PROVIDER_SITE_OTHER): Payer: BC Managed Care – PPO | Admitting: Psychiatry

## 2019-09-11 DIAGNOSIS — F4322 Adjustment disorder with anxiety: Secondary | ICD-10-CM | POA: Diagnosis not present

## 2019-09-11 DIAGNOSIS — F4010 Social phobia, unspecified: Secondary | ICD-10-CM | POA: Diagnosis not present

## 2019-09-11 MED ORDER — SERTRALINE HCL 50 MG PO TABS: 50.0000 mg | ORAL_TABLET | Freq: Every day | ORAL | 0 refills | Status: AC

## 2019-09-11 NOTE — Patient Instructions (Signed)
1. Continue sertraline 50 mg daily  2. Next appointment: 11/6 at 9:20

## 2019-10-18 NOTE — Progress Notes (Deleted)
[facility] MD/PA/NP OP Progress Note  10/18/2019 10:52 AM Grace Munoz  MRN:  [id number]  Chief Complaint:  HPI: *** Visit Diagnosis: No diagnosis found.  Past Psychiatric History: Please see initial evaluation for full details. I have reviewed the history. No updates at this time.     Past Medical History:  Past Medical History:  Diagnosis Date  . Asthma    Childhood   No past surgical history on file.  Family Psychiatric History: Please see initial evaluation for full details. I have reviewed the history. No updates at this time.     Family History:  Family History  Problem Relation Age of Onset  . Hypertension Mother     Social History:  Social History   Socioeconomic History  . Marital status: Single    Spouse name: Not on file  . Number of children: Not on file  . Years of education: Not on file  . Highest education level: Not on file  Occupational History  . Not on file  Social Needs  . Financial resource strain: Not on file  . Food insecurity    Worry: Not on file    Inability: Not on file  . Transportation needs    Medical: Not on file    Non-medical: Not on file  Tobacco Use  . Smoking status: Never Smoker  . Smokeless tobacco: Never Used  Substance and Sexual Activity  . Alcohol use: No  . Drug use: No  . Sexual activity: Not on file  Lifestyle  . Physical activity    Days per week: Not on file    Minutes per session: Not on file  . Stress: Not on file  Relationships  . Social Herbalist on phone: Not on file    Gets together: Not on file    Attends religious service: Not on file    Active member of club or organization: Not on file    Attends meetings of clubs or organizations: Not on file    Relationship status: Not on file  Other Topics Concern  . Not on file  Social History Narrative  . Not on file    Allergies:  Allergies  Allergen Reactions  . Peanut-Containing Drug Products Anaphylaxis    Metabolic Disorder Labs: No  results found for: HGBA1C, MPG No results found for: PROLACTIN Lab Results  Component Value Date   CHOL 149 05/03/2018   TRIG 50.0 05/03/2018   HDL 68.60 05/03/2018   CHOLHDL 2 05/03/2018   VLDL 10.0 05/03/2018   LDLCALC 70 05/03/2018   LDLCALC 58 01/13/2017   Lab Results  Component Value Date   TSH 1.27 05/03/2018   TSH 1.78 01/13/2017    Therapeutic Level Labs: No results found for: LITHIUM No results found for: VALPROATE No components found for:  CBMZ  Current Medications: Current Outpatient Medications  Medication Sig Dispense Refill  . sertraline (ZOLOFT) 50 MG tablet Take 1 tablet (50 mg total) by mouth daily. 90 tablet 0   No current facility-administered medications for this visit.      Musculoskeletal: Strength & Muscle Tone: N/A Gait & Station: N/A Patient leans: N/A  Psychiatric Specialty Exam: ROS  There were no vitals taken for this visit.There is no height or weight on file to calculate BMI.  General Appearance: {Appearance:22683}  Eye Contact:  {BHH EYE CONTACT:22684}  Speech:  Clear and Coherent  Volume:  Normal  Mood:  {BHH MOOD:22306}  Affect:  {Affect (PAA):22687}  Thought Process:  Coherent  Orientation:  Full (Time, Place, and Person)  Thought Content: Logical   Suicidal Thoughts:  {ST/HT (PAA):22692}  Homicidal Thoughts:  {ST/HT (PAA):22692}  Memory:  Immediate;   Good  Judgement:  {Judgement (PAA):22694}  Insight:  {Insight (PAA):22695}  Psychomotor Activity:  Normal  Concentration:  Concentration: Good and Attention Span: Good  Recall:  Good  Fund of Knowledge: Good  Language: Good  Akathisia:  No  Handed:  Right  AIMS (if indicated): not done  Assets:  Communication Skills Desire for Improvement  ADL's:  Intact  Cognition: WNL  Sleep:  {BHH GOOD/FAIR/POOR:22877}   Screenings: PHQ2-9     Office Visit from 05/03/2018 in [facility]  PHQ-2 Total Score  0       Assessment and Plan:  Grace Munoz  is a 23 y.o. year old female with a history of depression, who presents for follow up appointment for No diagnosis found.  # Social anxiety disorder 3 r/ oGAD   Exam is notable for tearfulness which she describes anxiety, which has been consistent since initial interview.  There has been overall improvement in anxiety since starting sertraline.  Psychosocial stressors increase loss of her best friend's boyfriend, and she also reports conflict with her family in the past.  Other psychosocial stressors includes trauma like history in college.  Will continue sertraline to target anxiety.  Discussed potential side effect of SI in younger population.  She will greatly benefit from CBT; she is advised again to find a local psychiatrist/therapist in the area.   # Marijuana use She has not used marijuana since 9/4. Will continue motivational interview.   Plan  1.Continue sertraline 50 mg daily  2. Find a local psychiatrist, therapist 3. Next appointment: 11/6 at 9:20 for 30 mins, video [address], [place], [place], [place], [address]  Past trials of medication:denies  The patient demonstrates the following risk factors for suicide: Chronic risk factors for suicide include:psychiatric disorder ofanxiety. Acute risk factorsfor suicide include: unemployment. Protective factorsfor this patient include: positive social support, coping skills and hope for the future. Considering these factors, the overall suicide risk at this point appears to below. Patientisappropriate for outpatient follow up.  Neysa Hotter, MD 10/18/2019, 10:52 AM

## 2019-10-25 ENCOUNTER — Ambulatory Visit (HOSPITAL_COMMUNITY): Payer: BC Managed Care – PPO | Admitting: Psychiatry

## 2019-10-25 ENCOUNTER — Telehealth (HOSPITAL_COMMUNITY): Payer: Self-pay | Admitting: Psychiatry

## 2019-10-25 ENCOUNTER — Other Ambulatory Visit: Payer: Self-pay

## 2019-10-25 NOTE — Telephone Encounter (Signed)
Contacted her phone for appointment this morning. She states that she forgot to cancel the appointment. She has not found a psychiatrist in the area.  She is advise to contact the office to reschedule for follow up.
